# Patient Record
Sex: Female | Born: 1980 | Race: White | Hispanic: No | Marital: Married | Smoking: Current every day smoker
Health system: Southern US, Community
[De-identification: ages and names within clinical notes are randomized; demographics above are authoritative.]

## PROBLEM LIST (undated history)

## (undated) DIAGNOSIS — I493 Ventricular premature depolarization: Secondary | ICD-10-CM

## (undated) DIAGNOSIS — E785 Hyperlipidemia, unspecified: Secondary | ICD-10-CM

## (undated) DIAGNOSIS — G43909 Migraine, unspecified, not intractable, without status migrainosus: Secondary | ICD-10-CM

## (undated) DIAGNOSIS — L732 Hidradenitis suppurativa: Secondary | ICD-10-CM

## (undated) DIAGNOSIS — G4733 Obstructive sleep apnea (adult) (pediatric): Principal | ICD-10-CM

## (undated) DIAGNOSIS — I10 Essential (primary) hypertension: Secondary | ICD-10-CM

## (undated) DIAGNOSIS — L409 Psoriasis, unspecified: Secondary | ICD-10-CM

## (undated) DIAGNOSIS — K219 Gastro-esophageal reflux disease without esophagitis: Secondary | ICD-10-CM

## (undated) DIAGNOSIS — F419 Anxiety disorder, unspecified: Secondary | ICD-10-CM

## (undated) HISTORY — DX: Migraine, unspecified, not intractable, without status migrainosus: G43.909

## (undated) HISTORY — DX: Hidradenitis suppurativa: L73.2

## (undated) HISTORY — DX: Essential (primary) hypertension: I10

## (undated) HISTORY — DX: Obstructive sleep apnea (adult) (pediatric): G47.33

## (undated) HISTORY — DX: Ventricular premature depolarization: I49.3

## (undated) HISTORY — DX: Hyperlipidemia, unspecified: E78.5

## (undated) HISTORY — DX: Psoriasis, unspecified: L40.9

---
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
## 2000-11-18 ENCOUNTER — Other Ambulatory Visit: Admission: RE | Admit: 2000-11-18 | Discharge: 2000-11-18 | Payer: Self-pay | Admitting: Unknown Physician Specialty

## 2000-11-18 ENCOUNTER — Other Ambulatory Visit: Admission: RE | Admit: 2000-11-18 | Discharge: 2000-11-18 | Payer: Self-pay | Admitting: Family Medicine

## 2002-02-02 ENCOUNTER — Other Ambulatory Visit: Admission: RE | Admit: 2002-02-02 | Discharge: 2002-02-02 | Payer: Self-pay | Admitting: Family Medicine

## 2002-03-10 ENCOUNTER — Emergency Department (HOSPITAL_COMMUNITY): Admission: EM | Admit: 2002-03-10 | Discharge: 2002-03-10 | Payer: Self-pay | Admitting: Emergency Medicine

## 2002-08-26 ENCOUNTER — Encounter (HOSPITAL_BASED_OUTPATIENT_CLINIC_OR_DEPARTMENT_OTHER): Payer: Self-pay | Admitting: General Surgery

## 2002-08-26 ENCOUNTER — Ambulatory Visit (HOSPITAL_COMMUNITY): Admission: RE | Admit: 2002-08-26 | Discharge: 2002-08-26 | Payer: Self-pay | Admitting: General Surgery

## 2003-01-09 ENCOUNTER — Ambulatory Visit (HOSPITAL_COMMUNITY): Admission: RE | Admit: 2003-01-09 | Discharge: 2003-01-09 | Payer: Self-pay

## 2003-02-14 ENCOUNTER — Other Ambulatory Visit: Admission: RE | Admit: 2003-02-14 | Discharge: 2003-02-14 | Payer: Self-pay | Admitting: Family Medicine

## 2003-03-06 ENCOUNTER — Encounter: Payer: Self-pay | Admitting: Family Medicine

## 2003-03-06 ENCOUNTER — Ambulatory Visit (HOSPITAL_COMMUNITY): Admission: RE | Admit: 2003-03-06 | Discharge: 2003-03-06 | Payer: Self-pay | Admitting: Family Medicine

## 2004-03-12 ENCOUNTER — Other Ambulatory Visit: Admission: RE | Admit: 2004-03-12 | Discharge: 2004-03-12 | Payer: Self-pay | Admitting: Family Medicine

## 2005-06-05 ENCOUNTER — Ambulatory Visit (HOSPITAL_COMMUNITY): Admission: RE | Admit: 2005-06-05 | Discharge: 2005-06-05 | Payer: Self-pay | Admitting: Obstetrics & Gynecology

## 2005-06-14 ENCOUNTER — Inpatient Hospital Stay: Admission: AD | Admit: 2005-06-14 | Discharge: 2005-06-14 | Payer: Self-pay | Admitting: Obstetrics & Gynecology

## 2005-07-10 ENCOUNTER — Inpatient Hospital Stay (HOSPITAL_COMMUNITY): Admission: AD | Admit: 2005-07-10 | Discharge: 2005-07-10 | Payer: Self-pay | Admitting: Obstetrics and Gynecology

## 2005-08-17 ENCOUNTER — Inpatient Hospital Stay (HOSPITAL_COMMUNITY): Admission: AD | Admit: 2005-08-17 | Discharge: 2005-08-17 | Payer: Self-pay | Admitting: Gastroenterology

## 2005-08-25 ENCOUNTER — Inpatient Hospital Stay (HOSPITAL_COMMUNITY): Admission: AD | Admit: 2005-08-25 | Discharge: 2005-08-29 | Payer: Self-pay | Admitting: Obstetrics and Gynecology

## 2005-10-02 ENCOUNTER — Other Ambulatory Visit: Admission: RE | Admit: 2005-10-02 | Discharge: 2005-10-02 | Payer: Self-pay | Admitting: Obstetrics and Gynecology

## 2007-02-04 ENCOUNTER — Emergency Department (HOSPITAL_COMMUNITY): Admission: EM | Admit: 2007-02-04 | Discharge: 2007-02-04 | Payer: Self-pay | Admitting: Emergency Medicine

## 2007-07-13 ENCOUNTER — Ambulatory Visit (HOSPITAL_COMMUNITY): Admission: RE | Admit: 2007-07-13 | Discharge: 2007-07-13 | Payer: Self-pay | Admitting: *Deleted

## 2010-09-19 ENCOUNTER — Ambulatory Visit (HOSPITAL_COMMUNITY): Admission: RE | Admit: 2010-09-19 | Discharge: 2010-09-19 | Payer: Self-pay | Admitting: Family Medicine

## 2010-09-24 ENCOUNTER — Ambulatory Visit (HOSPITAL_COMMUNITY): Admission: RE | Admit: 2010-09-24 | Discharge: 2010-09-24 | Payer: Self-pay | Admitting: Family Medicine

## 2011-03-21 NOTE — Discharge Summary (Signed)
Barbara Jefferson, Barbara Jefferson                ACCOUNT NO.:  [id number]   MEDICAL RECORD NO.:  [id number]          PATIENT TYPE:  INP   LOCATION:  9146                          FACILITY:  [facility]   PHYSICIAN:  Miguel Aschoff, M.D.       DATE OF BIRTH:  06/08/1981   DATE OF ADMISSION:  08/25/2005  DATE OF DISCHARGE:  08/29/2005                                 DISCHARGE SUMMARY   FINAL DIAGNOSES:  1.  Intrauterine pregnancy at 39+ weeks gestation.  2.  Induction.  3.  Arrest of the active phase of labor.  4.  Postoperative anemia.   SURGEON:  Dr. Carrington Clamp.   ASSISTANT:  Dr. Lodema Hong.   COMPLICATIONS:  None.   This 30 year old G1 P0 presents at 39+ weeks gestation for induction with  favorable cervix. The patient's antepartum course up to this point had been  complicated by history of smoking during the pregnancy; cessation was  discussed with the patient. The patient continued to smoke. The patient also  was Rh negative, did receive RhoGAM at 28 weeks. She had a negative group B  strep culture obtained in the office at 35 weeks. She is admitted for  induction at this time. The patient had a normal labor curve. She dilated to  about 8 cm and stayed at that for about 4 hours with no progress. At this  point a discussion was held with the patient and a decision was made to  proceed with a cesarean section secondary to the arrest of the active phase  of labor. The patient was taken to the operating room by Dr. Carrington Clamp where primary low transverse cesarean section was performed with the  delivery of an 8-pound 3-ounce female infant with Apgars of 8 and 9. The baby  was in an OP presentation. Delivery went without complications. The  patient's postoperative course was complicated by some postoperative anemia.  She was started on iron during her hospital course. She was felt ready for  discharge on postoperative day #3. She was sent home on a regular diet, told  to decrease activities,  told to continue her iron supplement daily, was  given Tylox one to two every 4 hours as needed for pain, was told she could  use over-the-counter ibuprofen up to 600 mg every 6 hours as needed for  pain, and was to follow up in the office in 4-6 weeks.   LABORATORY ON DISCHARGE:  The patient had a hemoglobin of 8.0; white blood  cell count of 10.8; and platelets of 154,000. The patient did receive her  RhoGAM shot before discharge as well.      [name], P.A.-C.      Miguel Aschoff, M.D.  Electronically Signed    MB/MEDQ  D:  09/18/2005  T:  09/18/2005  Job:  [id number]

## 2011-03-21 NOTE — Op Note (Signed)
Barbara Jefferson, VANGORDEN                ACCOUNT NO.:  [id number]   MEDICAL RECORD NO.:  [id number]          PATIENT TYPE:  INP   LOCATION:  9146                          FACILITY:  [facility]   PHYSICIAN:  Carrington Clamp, M.D. DATE OF BIRTH:  [birth year]   DATE OF PROCEDURE:  08/26/2005  DATE OF DISCHARGE:                                 OPERATIVE REPORT   PREOPERATIVE DIAGNOSIS:  Arrest of active phase.   POSTOPERATIVE DIAGNOSIS:  Arrest of active phase.   PROCEDURES:  Primary low transverse cesarean section.   SURGEON:  Carrington Clamp, M.D.   ASSISTANT:  Luvenia Redden, M.D.   ANESTHESIA:  Was epidural.   SPECIMENS:  None.   ESTIMATED BLOOD LOSS:  Was 600 mL.   IV FLUIDS:  2500 mL.   URINE OUTPUT:  Was 175 mL.   COMPLICATIONS:  None.   FINDINGS:  A female infant in vertex presentation, OP, Apgars 08/09, weight 8  pounds 3 ounces. Normal tubes, ovaries and uterus seen. Medications were  Methergine, Pitocin and Ancef. Counts were correct x3.   TECHNIQUE:  After adequate epidural anesthesia was achieved, the patient was  prepped, draped in sterile fashion in dorsal supine position with leftward  tilt.  A Pfannenstiel skin incision was made with scalpel, carried down to  fascia with Bovie cautery. The fascia was incised in midline with scalpel  and carried in a transverse curvilinear manner with the Mayo scissors. The  fascia was reflected superiorly and inferiorly from the rectus muscles. The  rectus muscles split in midline. Bowel free portion peritoneum was tented up  and entered into carefully. The peritoneum was stretched open and the  bladder blade placed. Vesicouterine fascia was tented up and cut in a  transverse curvilinear manner with the Metzenbaum scissors. The bladder flap  was created bluntly and the bladder blade replaced.   A 2 cm incision was made transversely in the upper portion of the lower  uterine segment until meconium could be seen. This was the first  time  meconium was noted. The baby was identified in the vertex presentation and  the head was delivered through the incision. Attention then was made at the  DeLee suction, although suction was not strong enough to adequately clear  the nares of the baby. Baby was then delivered and handed to awaiting  pediatrics after cord was clamped and cut. Cord bloods were obtained and the  placenta removed manually. The uterus was then exteriorized, wrapped in wet  lap, cleared of all debris. The uterine incision was closed with running  stitch of 0 Monocryl. An imbricating layer of 0 Monocryl was performed. The  uterus was extremely limp and boggy at first and once closure and 40 units  of Pitocin were given it started to firm up a little bit. A dose of  Methergine was given at this point as well and although the uterus was not  quite as firm as it could be, it was certainly on its way to being more  firm. Uterine incision was hemostatic and the uterus was reapproximated in  the abdominal  cavity. Irrigation was performed and gutters cleared of  debris. Uterine incision was reinspected, found to be hemostatic. All  instruments withdrawn from the abdomen and the peritoneum was closed running  stitch of 2-0 Vicryl. This incorporated in the same stitch but separate  layer of the closure of the rectus muscles. The fascia was closed with  running stitch of 0 Vicryl. The subcutaneous tissue was rendered hemostatic  with Bovie cautery and irrigation and then closed with interrupted stitches.  The skin was closed with staples. The patient tolerated the procedure well.  She returns to recovery room in stable condition.      Carrington Clamp, M.D.  Electronically Signed     MH/MEDQ  D:  08/26/2005  T:  08/26/2005  Job:  [id number]

## 2011-07-20 ENCOUNTER — Encounter: Payer: Self-pay | Admitting: *Deleted

## 2011-07-20 ENCOUNTER — Emergency Department (HOSPITAL_BASED_OUTPATIENT_CLINIC_OR_DEPARTMENT_OTHER)
Admission: EM | Admit: 2011-07-20 | Discharge: 2011-07-20 | Disposition: A | Discharge status: Home / Self Care | Payer: 59 | Attending: Emergency Medicine | Admitting: Emergency Medicine

## 2011-07-20 DIAGNOSIS — F17200 Nicotine dependence, unspecified, uncomplicated: Secondary | ICD-10-CM | POA: Insufficient documentation

## 2011-07-20 DIAGNOSIS — M542 Cervicalgia: Secondary | ICD-10-CM | POA: Insufficient documentation

## 2011-07-20 MED ORDER — CYCLOBENZAPRINE HCL 5 MG PO TABS: 5.0000 mg | ORAL_TABLET | Freq: Two times a day (BID) | ORAL | Status: AC | PRN

## 2011-07-20 NOTE — ED Notes (Signed)
Care plan reviewed with pt review of Meds

## 2011-07-20 NOTE — ED Notes (Signed)
Pt states she got out of shower and noticed she had right side neck and arm pain. No known injury.

## 2011-07-20 NOTE — ED Notes (Signed)
Pt reports neck pain with radiation to R arm x 2 days denies any Injury

## 2011-07-20 NOTE — ED Provider Notes (Signed)
History     CSN: [id number] Arrival date & time: 07/20/2011  3:08 PM   Chief Complaint  Patient presents with  . 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 female presenting with neck pain. The history is provided by the patient.  Neck Pain  This is a new problem. The current episode started more than 2 days ago. The problem occurs hourly. The problem has not changed since onset.There has been no fever. The pain is present in the right side. The pain does not radiate. The pain is moderate. The symptoms are aggravated by position. Pertinent negatives include no numbness, no tingling and no weakness. She has tried muscle relaxants and NSAIDs for the symptoms. The treatment provided mild relief.     History reviewed. No pertinent past medical history.   Past Surgical History  Procedure Date  . Cesarean section     History reviewed. No pertinent family history.  History  Substance Use Topics  . Smoking status: Current Everyday Smoker  . Smokeless tobacco: Not on file  . Alcohol Use: No    OB History    Grav Para Term Preterm Abortions TAB SAB Ect Mult Living                  Review of Systems  HENT: Positive for neck pain.   Neurological: Negative for tingling, weakness and numbness.  All other systems reviewed and are negative.    Allergies  Review of patient's allergies indicates no known allergies.  Home Medications   Current Outpatient Rx  Name Route Sig Dispense Refill  . ACETAMINOPHEN 500 MG PO TABS Oral Take 500 mg by mouth every 6 (six) hours as needed. Pain      . ETONOGESTREL-ETHINYL ESTRADIOL 0.12-0.015 MG/24HR VA RING Vaginal Place 1 each vaginally every 28 (twenty-eight) days. Insert vaginally and leave in place for 3 consecutive weeks, then remove for 1  week.     . IBUPROFEN 200 MG PO TABS Oral Take 600 mg by mouth every 6 (six) hours as needed. pain       Physical Exam    BP 133/81  Pulse 89  Temp(Src) 98.3 F (36.8 C) (Oral)  Resp 16  Ht 5\' 4"  (1.626 m)  Wt 190 lb (86.183 kg)  BMI 32.61 kg/m2  SpO2 99%  Physical Exam  Nursing note and vitals reviewed. Constitutional: She appears well-developed.  HENT:  Head: Normocephalic and atraumatic.  Neck: Normal range of motion. Neck supple.  Cardiovascular: Normal rate and regular rhythm.   Pulmonary/Chest: Effort normal and breath sounds normal.  Musculoskeletal: Normal range of motion.       Cervical back: She exhibits bony tenderness. She exhibits no edema.  Neurological: She is alert.    ED Course  Procedures  No results found for this or any previous visit. No results found.   No diagnosis found.   MDM Pt not having any deficits:likely muscle strain:don't think any need for imaging at this time       Teressa Lower, NP 07/20/11 1612

## 2011-07-22 NOTE — ED Provider Notes (Signed)
History/physical exam/procedure(s) were performed by non-physician practitioner and as supervising physician I was immediately available for consultation/collaboration. I have reviewed all notes and am in agreement with care and plan.  Hilario Quarry, MD 07/22/11 [phone]

## 2011-08-05 ENCOUNTER — Encounter (HOSPITAL_BASED_OUTPATIENT_CLINIC_OR_DEPARTMENT_OTHER): Payer: Self-pay | Admitting: *Deleted

## 2011-08-05 ENCOUNTER — Emergency Department (HOSPITAL_BASED_OUTPATIENT_CLINIC_OR_DEPARTMENT_OTHER)
Admission: EM | Admit: 2011-08-05 | Discharge: 2011-08-05 | Disposition: A | Discharge status: Home / Self Care | Payer: 59 | Attending: Emergency Medicine | Admitting: Emergency Medicine

## 2011-08-05 DIAGNOSIS — R059 Cough, unspecified: Secondary | ICD-10-CM

## 2011-08-05 DIAGNOSIS — R05 Cough: Secondary | ICD-10-CM

## 2011-08-05 DIAGNOSIS — F17200 Nicotine dependence, unspecified, uncomplicated: Secondary | ICD-10-CM | POA: Insufficient documentation

## 2011-08-05 MED ORDER — BENZONATATE 100 MG PO CAPS
200.0000 mg | ORAL_CAPSULE | Freq: Once | ORAL | Status: AC
  Administered 2011-08-05: 200 mg via ORAL
  Filled 2011-08-05: qty 2

## 2011-08-05 MED ORDER — HYDROCOD POLST-CHLORPHEN POLST 10-8 MG/5ML PO LQCR: 5.0000 mL | Freq: Two times a day (BID) | ORAL | Status: DC | PRN

## 2011-08-05 MED ORDER — BENZONATATE 200 MG PO CAPS: 200.0000 mg | ORAL_CAPSULE | Freq: Three times a day (TID) | ORAL | Status: AC | PRN

## 2011-08-05 NOTE — ED Provider Notes (Signed)
History     CSN: [id number] Arrival date & time: 08/05/2011  9:28 PM  Chief Complaint  Patient presents with  . Cough    HPI Patient is a 30 yo F who presents with 3 days of cough.  She denies any other symptoms including fever, nasal congestion, chest pain, or any pain at all.  She says the cough is severe enough that she has not been able to sleep.  She has tried decongestants, cough syrup, and cough medicine without any relief.  There are no other associated or modifying factors.  The cough has been non-productive.  History reviewed. No pertinent past medical history.  Past Surgical History  Procedure Date  . Cesarean section     No family history on file.  History  Substance Use Topics  . Smoking status: Current Everyday Smoker  . Smokeless tobacco: Not on file  . Alcohol Use: No    OB History    Grav Para Term Preterm Abortions TAB SAB Ect Mult Living                  Review of Systems  Constitutional: Negative.   HENT: Negative.   Eyes: Negative.   Respiratory: Positive for cough.   Cardiovascular: Negative.   Gastrointestinal: Negative.   Genitourinary: Negative.   Musculoskeletal: Negative.   Skin: Negative.   Neurological: Negative.   Hematological: Negative.   Psychiatric/Behavioral: Negative.   All other systems reviewed and are negative.    Allergies  Review of patient's allergies indicates no known allergies.  Home Medications   Current Outpatient Rx  Name Route Sig Dispense Refill  . ETONOGESTREL-ETHINYL ESTRADIOL 0.12-0.015 MG/24HR VA RING Vaginal Place 1 each vaginally every 28 (twenty-eight) days. Insert vaginally and leave in place for 3 consecutive weeks, then remove for 1 week.     . ACETAMINOPHEN 500 MG PO TABS Oral Take 500 mg by mouth every 6 (six) hours as needed. Pain      . IBUPROFEN 200 MG PO TABS Oral Take 600 mg by mouth every 6 (six) hours as needed. pain       BP 145/92  Pulse 89  Temp(Src) 98.7 F (37.1 C) (Oral)   Resp 17  SpO2 98%  Physical Exam  Nursing note and vitals reviewed. Constitutional: She is oriented to person, place, and time. She appears well-developed and well-nourished. No distress.  HENT:  Head: Normocephalic and atraumatic.  Eyes: Conjunctivae are normal. Pupils are equal, round, and reactive to light.  Neck: Normal range of motion.  Cardiovascular: Normal rate, regular rhythm, normal heart sounds and intact distal pulses.  Exam reveals no gallop and no friction rub.   No murmur heard. Pulmonary/Chest: Effort normal and breath sounds normal. No respiratory distress. She has no wheezes. She has no rales.  Abdominal: Soft. Bowel sounds are normal. She exhibits no distension. There is no tenderness. There is no rebound and no guarding.  Musculoskeletal: Normal range of motion. She exhibits no edema.  Neurological: She is alert and oriented to person, place, and time. No cranial nerve deficit. Coordination normal.  Skin: Skin is warm and dry. No rash noted. No erythema.  Psychiatric: She has a normal mood and affect.    ED Course  Procedures (including critical care time)  Labs Reviewed - No data to display No results found.   1. Cough       MDM  Patient was evaluated and was concerned only about treatment of her cough.  She had no  fever or shortness of breath.  She was given tessalon perles.  She was given a prescription fo this and tussionex.  She did not require additional work-up today.  She was also instructed to stop smoking.  Patient was discharged home in good condition.        Cyndra Numbers, MD 08/08/11 [phone]

## 2011-08-05 NOTE — ED Notes (Signed)
Pt states that she has had a cough since yesterday and was told to eat ice chips and not use her inhaler. Pt states that she has no hx of asthma. Pt is not in distress.

## 2011-08-05 NOTE — ED Notes (Signed)
Cough since yesterday.   

## 2011-10-07 ENCOUNTER — Other Ambulatory Visit (HOSPITAL_COMMUNITY): Payer: Self-pay | Admitting: Family Medicine

## 2011-10-07 DIAGNOSIS — D4410 Neoplasm of uncertain behavior of unspecified adrenal gland: Secondary | ICD-10-CM

## 2011-10-07 DIAGNOSIS — R5381 Other malaise: Secondary | ICD-10-CM

## 2011-10-07 DIAGNOSIS — E785 Hyperlipidemia, unspecified: Secondary | ICD-10-CM

## 2011-10-09 ENCOUNTER — Ambulatory Visit (HOSPITAL_COMMUNITY): Payer: 59

## 2011-10-13 ENCOUNTER — Ambulatory Visit (HOSPITAL_COMMUNITY)
Admission: RE | Admit: 2011-10-13 | Discharge: 2011-10-13 | Disposition: A | Discharge status: Home / Self Care | Payer: 59 | Source: Ambulatory Visit | Attending: Family Medicine | Admitting: Family Medicine

## 2011-10-13 DIAGNOSIS — E785 Hyperlipidemia, unspecified: Secondary | ICD-10-CM

## 2011-10-13 DIAGNOSIS — E279 Disorder of adrenal gland, unspecified: Secondary | ICD-10-CM | POA: Insufficient documentation

## 2011-10-13 DIAGNOSIS — D4410 Neoplasm of uncertain behavior of unspecified adrenal gland: Secondary | ICD-10-CM

## 2011-10-13 DIAGNOSIS — K8020 Calculus of gallbladder without cholecystitis without obstruction: Secondary | ICD-10-CM | POA: Insufficient documentation

## 2011-10-13 DIAGNOSIS — R5381 Other malaise: Secondary | ICD-10-CM

## 2012-11-10 ENCOUNTER — Other Ambulatory Visit (HOSPITAL_COMMUNITY): Payer: Self-pay | Admitting: Family Medicine

## 2012-11-10 DIAGNOSIS — R1011 Right upper quadrant pain: Secondary | ICD-10-CM

## 2012-11-12 ENCOUNTER — Encounter (HOSPITAL_COMMUNITY): Payer: Self-pay

## 2012-11-12 ENCOUNTER — Encounter (HOSPITAL_COMMUNITY)
Admission: RE | Admit: 2012-11-12 | Discharge: 2012-11-12 | Disposition: A | Discharge status: Home / Self Care | Payer: PRIVATE HEALTH INSURANCE | Source: Ambulatory Visit | Attending: Family Medicine | Admitting: Family Medicine

## 2012-11-12 DIAGNOSIS — R1011 Right upper quadrant pain: Secondary | ICD-10-CM

## 2012-11-12 MED ORDER — TECHNETIUM TC 99M MEBROFENIN IV KIT
5.0000 | PACK | Freq: Once | INTRAVENOUS | Status: AC | PRN
  Administered 2012-11-12: 5.5 via INTRAVENOUS

## 2012-12-09 ENCOUNTER — Ambulatory Visit (INDEPENDENT_AMBULATORY_CARE_PROVIDER_SITE_OTHER): Payer: Self-pay | Admitting: General Surgery

## 2012-12-10 ENCOUNTER — Encounter (INDEPENDENT_AMBULATORY_CARE_PROVIDER_SITE_OTHER): Payer: Self-pay | Admitting: General Surgery

## 2012-12-10 ENCOUNTER — Ambulatory Visit (INDEPENDENT_AMBULATORY_CARE_PROVIDER_SITE_OTHER): Payer: PRIVATE HEALTH INSURANCE | Admitting: General Surgery

## 2012-12-10 VITALS — BP 124/80 | HR 88 | Temp 98.8°F | Resp 18 | Ht 64.0 in | Wt 213.0 lb

## 2012-12-10 DIAGNOSIS — K8020 Calculus of gallbladder without cholecystitis without obstruction: Secondary | ICD-10-CM

## 2012-12-10 NOTE — Progress Notes (Signed)
Patient ID: Barbara Jefferson, female   DOB: 29-Jun-1981, 32 y.o.   MRN: [id number]  Chief Complaint  Patient presents with  . New Evaluation    [name]    HPI Barbara Jefferson is a 32 y.o. female.  Self referred HPI This is a 32 year old female who has high triglycerides and migraines who presents with about a one-month history of some nausea where she's not been eating as much. This lasted for about a week and then improved. She also had some upper chest pain. When asked she points to her upper chest as well as almost to the base of her left neck. She described no abdominal pain associated with this. She has had no real changes in her bowel movements at all either. She's been evaluated and has undergone an EKG which she reports as normal. She has changed to a low-fat diet and many of her symptoms have resolved with this. She has some bloating now she's increased her fiber intake. She was on some Prilosec previously but when she's been on a low-fat diet she is not needed Prilosec anymore at all. She has undergone ultrasound in the past that showed gallstones. She has also undergone an MRI that shows gallstones and this was due to follow up for adrenal mass. She recently also has a hiatus scan that is below.  Past Medical History  Diagnosis Date  . Migraine   . Hyperlipidemia     Past Surgical History  Procedure Date  . Cesarean section 08/26/05    Family History  Problem Relation Age of Onset  . Diabetes Father   . Hypertension Father   . Hypertension Brother     Social History History  Substance Use Topics  . Smoking status: Current Every Day Smoker -- 0.5 packs/day  . Smokeless tobacco: Not on file     Comment: smoking 10 yrs  . Alcohol Use: No    No Known Allergies  Current Outpatient Prescriptions  Medication Sig Dispense Refill  . etonogestrel-ethinyl estradiol (NUVARING) 0.12-0.015 MG/24HR vaginal ring Place 1 each vaginally every 28 (twenty-eight) days. Insert vaginally and leave  in place for 3 consecutive weeks, then remove for 1 week.       . fenofibrate 160 MG tablet Take 160 mg by mouth daily.      Marland Kitchen ibuprofen (ADVIL,MOTRIN) 200 MG tablet Take 600 mg by mouth every 6 (six) hours as needed. pain       . niacin (NIASPAN) 1000 MG CR tablet Take 1,000 mg by mouth 2 (two) times daily.      Marland Kitchen acetaminophen (TYLENOL) 500 MG tablet Take 500 mg by mouth every 6 (six) hours as needed. Pain        . chlorpheniramine-HYDROcodone (TUSSIONEX PENNKINETIC ER) 10-8 MG/5ML LQCR Take 5 mLs by mouth every 12 (twelve) hours as needed (cough).  115 mL  0    Review of Systems Review of Systems  Constitutional: Negative for fever, chills and unexpected weight change.  HENT: Negative for hearing loss, congestion, sore throat, trouble swallowing and voice change.   Eyes: Negative for visual disturbance.  Respiratory: Negative for cough and wheezing.   Cardiovascular: Negative for chest pain, palpitations and leg swelling.  Gastrointestinal: Negative for nausea, vomiting, abdominal pain, diarrhea, constipation, blood in stool, abdominal distention and anal bleeding.  Genitourinary: Negative for hematuria, vaginal bleeding and difficulty urinating.  Musculoskeletal: Negative for arthralgias.  Skin: Negative for rash and wound.  Neurological: Negative for seizures, syncope and headaches.  Hematological: Negative for  adenopathy. Does not bruise/bleed easily.  Psychiatric/Behavioral: Negative for confusion.    Blood pressure 124/80, pulse 88, temperature 98.8 F (37.1 C), resp. rate 18, height 5\' 4"  (1.626 m), weight 213 lb (96.616 kg), last menstrual period 11/09/2012.  Physical Exam Physical Exam  Vitals reviewed. Constitutional: She appears well-developed and well-nourished.  Cardiovascular: Normal rate, regular rhythm and normal heart sounds.   Pulmonary/Chest: Effort normal. She has no wheezes. She has no rales.  Abdominal: Soft. Bowel sounds are normal. There is no tenderness.     Data Reviewed NUCLEAR MEDICINE HEPATOBILIARY IMAGING WITH GALLBLADDER EF:  Technique: Sequential images of the abdomen were obtained for 60  minutes following intravenous administration of  radiopharmaceutical. Patient then ingested 8 ounces of  commercially available Ensure Plus and imaging was continued for 60  minutes. A time-activity curve was generated from tracer within  the gallbladder following Ensure Plus ingestion, and the  gallbladder ejection fraction was calculated.  Radiopharmaceutical: 5.5 mCi Tc-56m mebrofenin  Comparison: None  Findings:  Prompt tracer extraction from bloodstream, indicating normal  hepatocellular function.  Prompt excretion of tracer into biliary tree.  Gallbladder visualized at 15 minutes.  Small bowel visualized at 34 minutes.  No hepatic retention of tracer.  Subjectively decreased emptying of tracer from gallbladder  following fatty meal stimulation.  Calculated gallbladder ejection fraction is 30%, minimally  decreased.  Patient experienced no symptoms following fatty meal ingestion.  IMPRESSION:  Patent biliary tree.  Minimally decreased gallbladder ejection fraction following fatty  meal stimulation.   Assessment    Asymptomatic cholelithiasis    Plan    She describes upper chest pain. She did not really have any symptoms that I can find that are referable to her gallbladder at this time. 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 Her HIDA is essentially negative. She had no symptoms during that scan and all either. I think her gallstones are asymptomatic and I recommended to her that she not undergo surgery at this time. We did discuss his symptoms are related to gallbladder disease and she certainly might develop symptoms moving forward.       Vitali Seibert 12/10/2012, 2:56 PM

## 2014-05-26 IMAGING — NM NM HEPATO W/GB/PHARM/[PERSON_NAME]
2 series · 12 of 12 positions shown · non-contrast
Comparison: None

CLINICAL DATA: Right upper quadrant pain, nausea, history
cholelithiasis

NUCLEAR MEDICINE HEPATOBILIARY IMAGING WITH GALLBLADDER EF:
TECHNIQUE: Sequential images of the abdomen were obtained for 60
minutes following intravenous administration of
radiopharmaceutical.  Patient then ingested 8 ounces of
commercially available Ensure Plus and imaging was continued for 60
minutes.  A time-activity curve was generated from tracer within
the gallbladder following Ensure Plus ingestion, and the
gallbladder ejection fraction was calculated.
Radiopharmaceutical:  5.5 mCi Mc-UUm mebrofenin

[hida · 3.20mm/px · 6 of 60 frames shown (1 of 2)]
[frame 6/60]
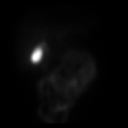
[frame 16/60]
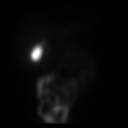
[frame 26/60]
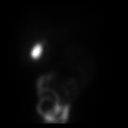
[frame 36/60]
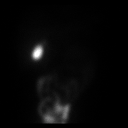
[frame 46/60]
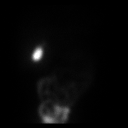
[frame 56/60]
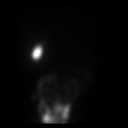

[hida · 3.20mm/px · 6 of 60 frames shown (2 of 2)]
[frame 6/60]
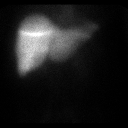
[frame 16/60]
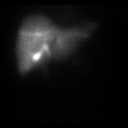
[frame 26/60]
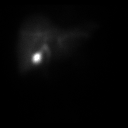
[frame 36/60]
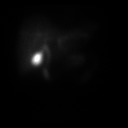
[frame 46/60]
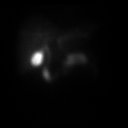
[frame 56/60]
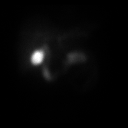

[12 of 12 positions shown; findings below may reference images not displayed]

FINDINGS: Prompt tracer extraction from bloodstream, indicating normal
hepatocellular function.
Prompt excretion of tracer into biliary tree.
Gallbladder visualized at 15 minutes.
Small bowel visualized at 34 minutes.
No hepatic retention of tracer.

Subjectively decreased emptying of tracer from gallbladder
following fatty meal stimulation.
Calculated gallbladder ejection fraction is 30%, minimally
decreased.
Patient experienced no symptoms following fatty meal ingestion.
IMPRESSION: Patent biliary tree.
Minimally decreased gallbladder ejection fraction following fatty
meal stimulation.

Normal values for gallbladder ejection fraction:
> 30% for exams utilizing sincalide (CCK)
> 33% for exams utilizing fatty meal stimulation with Ensure Plus

## 2016-11-27 ENCOUNTER — Other Ambulatory Visit (INDEPENDENT_AMBULATORY_CARE_PROVIDER_SITE_OTHER): Payer: PRIVATE HEALTH INSURANCE

## 2016-11-27 ENCOUNTER — Ambulatory Visit (INDEPENDENT_AMBULATORY_CARE_PROVIDER_SITE_OTHER): Payer: PRIVATE HEALTH INSURANCE

## 2016-11-27 ENCOUNTER — Other Ambulatory Visit: Payer: Self-pay | Admitting: Orthopedic Surgery

## 2016-11-27 DIAGNOSIS — M545 Low back pain: Secondary | ICD-10-CM

## 2016-11-27 DIAGNOSIS — R52 Pain, unspecified: Secondary | ICD-10-CM

## 2017-06-22 ENCOUNTER — Telehealth: Payer: Self-pay | Admitting: Cardiovascular Disease

## 2017-06-22 NOTE — Telephone Encounter (Signed)
Received records from [facility] for appointment on 07/08/17 with Dr Oval Linsey.  Records put with Dr Blenda Mounts schedule for 07/08/17. lp

## 2017-06-30 ENCOUNTER — Telehealth: Payer: Self-pay | Admitting: Nurse Practitioner

## 2017-06-30 ENCOUNTER — Ambulatory Visit (INDEPENDENT_AMBULATORY_CARE_PROVIDER_SITE_OTHER): Payer: PRIVATE HEALTH INSURANCE | Admitting: Cardiovascular Disease

## 2017-06-30 ENCOUNTER — Encounter: Payer: Self-pay | Admitting: Cardiovascular Disease

## 2017-06-30 VITALS — BP 126/80 | HR 96 | Ht 64.0 in | Wt 219.8 lb

## 2017-06-30 DIAGNOSIS — I493 Ventricular premature depolarization: Secondary | ICD-10-CM | POA: Diagnosis not present

## 2017-06-30 DIAGNOSIS — E784 Other hyperlipidemia: Secondary | ICD-10-CM | POA: Diagnosis not present

## 2017-06-30 DIAGNOSIS — E7849 Other hyperlipidemia: Secondary | ICD-10-CM

## 2017-06-30 NOTE — Patient Instructions (Signed)
Medication Instructions:  Your physician recommends that you continue on your current medications as directed. Please refer to the Current Medication list given to you today.   Labwork: None Ordered   Testing/Procedures: None Ordered   Follow-Up: Your physician recommends that you schedule a follow-up appointment in: 3 months with Dr. Acie Fredrickson  Increase your intake of fluids (water with electrolyte tabs like Nun tablets, or gatorade) , protein ( hard boiled eggs, chicken, fish) , and a electrolytes ( V-8 juice, salt, potassium chloride  which is sold as No-Salt   If you need a refill on your cardiac medications before your next appointment, please call your pharmacy.   Thank you for choosing [facility]! Christen Bame, RN [phone]

## 2017-06-30 NOTE — Telephone Encounter (Signed)
Patient called back to ask if Dr. Acie Fredrickson can prescribe something for anxiety. She states this was discussed during her office visit today. I spoke with Dr. Acie Fredrickson who advised that anxiety was discussed as a possible cause but the patient did not acknowledge that she thought this was a problem for her. He will write this as a possible cause of her palpitations but he would like her to work on lifestyle changes to decrease palpitations including decreasing caffeine consumption, starting an exercise program, and quit smoking. I advised that a copy of today's office visit will be sent to her PCP. She verbalized understanding and agreement with plan and thanked me for the call.

## 2017-06-30 NOTE — Progress Notes (Addendum)
Cardiology Office Note:    Date:  06/30/2017   ID:  Barbara Jefferson, DOB 01-29-1981, MRN [id number]  PCP:  Elsie Lincoln, MD  Cardiologist:  Mertie Moores, MD    Referring MD: Bridget Hartshorn, NP   Problem List 1.  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 Hyperlipidemia   Chief Complaint  Patient presents with  . Palpitations    History of Present Illness:    Barbara Jefferson is a 36 y.o. female with a hx of palpitations. She Has a history of palpitations for the past several years. They typically occur later in the evening or when she's trying to go to sleep. There is no syncope or presyncope. She does not describe any chest pain. There is no dyspnea.  Typically works out regularly . Has not felt well for the past 2 weeks. Not necessarily associated with her periods Has cut her caffiene - still drinks diet mountain dew.  Smokes 1/2 ddp.   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   Past Medical History:  Diagnosis Date  . Hyperlipidemia   . Migraine     Past Surgical History:  Procedure Laterality Date  . CESAREAN SECTION  08/26/05    Current Medications: Current Meds  Medication Sig  . acetaminophen (TYLENOL) 500 MG tablet Take 500 mg by mouth every 6 (six) hours as needed. Pain    . fenofibrate 160 MG tablet Take 160 mg by mouth daily.  Marland Kitchen ibuprofen (ADVIL,MOTRIN) 200 MG tablet Take 600 mg by mouth every 6 (six) hours as needed. pain   . levonorgestrel (MIRENA) 20 MCG/24HR IUD Mirena 20 mcg/24 hr (5 years) intrauterine device  Take by intrauterine route.  Marland Kitchen lisinopril (PRINIVIL,ZESTRIL) 5 MG tablet Take 1 tablet by mouth daily.  . Minocycline HCl 90 MG TB24 Take 1 tablet by mouth daily.  Marland Kitchen omeprazole (PRILOSEC) 20 MG capsule Take 1 capsule by mouth daily.  . rizatriptan (MAXALT) 10 MG tablet Take 10 mg by mouth daily as needed for migraine.     Allergies:   Latex   Social History   Social History  . Marital status: Married    Spouse name: N/A  . Number of  children: N/A  . Years of education: N/A   Social History Main Topics  . Smoking status: Current Every Day Smoker    Packs/day: 0.50  . Smokeless tobacco: Never Used     Comment: smoking 10 yrs  . Alcohol use No  . Drug use: No  . Sexual activity: Yes    Birth control/ protection: Inserts   Other Topics Concern  . None   Social History Narrative  . None     Family History: The patient's family history includes Diabetes in her father; Hypertension in her brother and father; Stroke in her mother. ROS:   Please see the history of present illness.     All other systems reviewed and are negative.  EKGs/Labs/Other Studies Reviewed:    The following studies were reviewed today:   EKG:  EKG is not  ordered today.  The ekg from 06/18/17 shows NSR with no ST or T wave changes  Recent Labs: No results found for requested labs within last 8760 hours.  Recent Lipid Panel No results found for: CHOL, TRIG, HDL, CHOLHDL, VLDL, LDLCALC, LDLDIRECT  Physical Exam:    VS:  BP 126/80   Pulse 96   Ht 5\' 4"  (1.626 m)   Wt 219 lb 12.8 oz (99.7 kg)  SpO2 97%   BMI 37.73 kg/m     Wt Readings from Last 3 Encounters:  06/30/17 219 lb 12.8 oz (99.7 kg)  12/10/12 213 lb (96.6 kg)  07/20/11 190 lb (86.2 kg)     GEN:  Well nourished, well developed in no acute distress HEENT: Normal NECK: No JVD; No carotid bruits LYMPHATICS: No lymphadenopathy CARDIAC: RRR, no murmurs, rubs, gallops RESPIRATORY:  Clear to auscultation without rales, wheezing or rhonchi  ABDOMEN: Soft, non-tender, non-distended MUSCULOSKELETAL:  No edema; No deformity  SKIN: Warm and dry NEUROLOGIC:  Alert and oriented x 3 PSYCHIATRIC:  Normal affect   ASSESSMENT:    No diagnosis found. PLAN:    In order of problems listed above:  1. 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 Ive reassured her that these are benign We discussed the typical causes of PVCs - low potassium, stress,  Discussed 30 day monitor She will  hold off for now - will lets [place] know if she wants to wear the monitor. TSH is normal, K is normal.  Discussed drinking a V-8 to help with these.   She has some anxiety issues.   This may be playing a role in her palpitations    2.  Hyperlipidemia :  Labs from [facility] were reviewed   Chol = 153 Trigs are 185. HDL - 29 LDL - 87  Will see her in 3 months    Medication Adjustments/Labs and Tests Ordered: Current medicines are reviewed at length with the patient today.  Concerns regarding medicines are outlined above.  No orders of the defined types were placed in this encounter.  No orders of the defined types were placed in this encounter.   Signed, Mertie Moores, MD  06/30/2017 10:07 AM    [place]

## 2017-07-07 ENCOUNTER — Telehealth: Payer: Self-pay | Admitting: Cardiovascular Disease

## 2017-07-07 DIAGNOSIS — R002 Palpitations: Secondary | ICD-10-CM

## 2017-07-07 NOTE — Telephone Encounter (Signed)
New message   Pt states she has questions about a monitor and was offered one.

## 2017-07-07 NOTE — Telephone Encounter (Signed)
Spoke with patient who states she has eliminated caffeine from her diet but continues to have palpitations. She requests getting a 30 day monitor as discussed with Dr. Acie Fredrickson during office visit. I advised that someone from our office will call her to schedule the monitor appointment. She verbalized understanding and thanked me for the call.

## 2017-07-08 ENCOUNTER — Ambulatory Visit: Payer: PRIVATE HEALTH INSURANCE | Admitting: Cardiovascular Disease

## 2017-07-13 ENCOUNTER — Ambulatory Visit (INDEPENDENT_AMBULATORY_CARE_PROVIDER_SITE_OTHER): Payer: PRIVATE HEALTH INSURANCE

## 2017-07-13 DIAGNOSIS — R002 Palpitations: Secondary | ICD-10-CM

## 2017-07-20 ENCOUNTER — Telehealth: Payer: Self-pay | Admitting: Cardiovascular Disease

## 2017-07-20 MED ORDER — PROPRANOLOL HCL 10 MG PO TABS: 10.0000 mg | ORAL_TABLET | Freq: Four times a day (QID) | ORAL | 11 refills | Status: DC | PRN

## 2017-07-20 NOTE — Telephone Encounter (Signed)
Cardiac monitor report shows occasional PVCs. Forwarding to Dr. Acie Fredrickson for advice.

## 2017-07-20 NOTE — Telephone Encounter (Signed)
Spoke with patient who states her PCP prescribed propranolol 20 mg and then 40 mg at bedtime. She states she has not noticed any improvement. I reviewed Dr. Elmarie Shiley protocol for Propranolol with her and she states she would like to d/c the dose prescribed by her PCP and follow his instructions. I advised I will send the Rx to [facility] and that she will need to notify the pharmacy that she is discontinuing the previous dose. I advised her to call back to let us know how she is doing. She verbalized understanding and agreement and thanked me for the call.

## 2017-07-20 NOTE — Telephone Encounter (Signed)
Barbara Jefferson is calling because she is wanting to know if it is necessary for her to continue to wear the event monitor for the remainder of the 30 days . Had it for only 8 days . Please call

## 2017-07-20 NOTE — Telephone Encounter (Signed)
Spoke with patient who called to ask if she has to wear the heart monitor for the full 30 days ordered. She states she has hit the record button many times already. I advised her that the more information we can get then the better we can understand her PVC burden and/or any other issues she may be dealing with. I encouraged her to wear the monitor as close to 30 days as possible. She asks if we could check the monitor up to this point to give her reassurance. I advised that I will check to see what has been recorded up to this point and call her back. She verbalized understanding and agreement.

## 2017-07-20 NOTE — Telephone Encounter (Signed)
Preliminary results shows occasional PVCs. I would like her  to try Propranolol 10 mg QID prn. She may also consider metoprolol 12.5 BID or perhaps 25 mg PO BID

## 2017-08-13 ENCOUNTER — Telehealth: Payer: Self-pay | Admitting: Cardiovascular Disease

## 2017-08-13 NOTE — Telephone Encounter (Signed)
Informed patient that results are not yet available but she will be called as soon as Dr. Acie Fredrickson reviews results.  She was grateful for update.

## 2017-08-13 NOTE — Telephone Encounter (Signed)
New message     Patient calling for results from monitor. Please call

## 2017-10-06 ENCOUNTER — Ambulatory Visit: Payer: PRIVATE HEALTH INSURANCE | Admitting: Cardiovascular Disease

## 2017-12-15 ENCOUNTER — Telehealth: Payer: PRIVATE HEALTH INSURANCE | Admitting: Family

## 2017-12-15 DIAGNOSIS — J019 Acute sinusitis, unspecified: Secondary | ICD-10-CM

## 2017-12-15 DIAGNOSIS — B9689 Other specified bacterial agents as the cause of diseases classified elsewhere: Secondary | ICD-10-CM

## 2017-12-15 MED ORDER — AZITHROMYCIN 250 MG PO TABS: ORAL_TABLET | ORAL | 0 refills | Status: DC

## 2017-12-15 NOTE — Progress Notes (Signed)
We are sorry that you are not feeling well.  Here is how we plan to help!  Based on what you have shared with me it looks like you have sinusitis.  Sinusitis is inflammation and infection in the sinus cavities of the head.  Based on your presentation I believe you most likely have Acute Bacterial Sinusitis.  This is an infection caused by bacteria and is treated with antibiotics. I have prescribed Z-pak as directed. You may use an oral decongestant such as Mucinex D or if you have glaucoma or high blood pressure use plain Mucinex. Saline nasal spray help and can safely be used as often as needed for congestion.  If you develop worsening sinus pain, fever or notice severe headache and vision changes, or if symptoms are not better after completion of antibiotic, please schedule an appointment with a health care provider.    Sinus infections are not as easily transmitted as other respiratory infection, however we still recommend that you avoid close contact with loved ones, especially the very young and elderly.  Remember to wash your hands thoroughly throughout the day as this is the number one way to prevent the spread of infection!  Home Care:  Only take medications as instructed by your medical team.  Complete the entire course of an antibiotic.  Do not take these medications with alcohol.  A steam or ultrasonic humidifier can help congestion.  You can place a towel over your head and breathe in the steam from hot water coming from a faucet.  Avoid close contacts especially the very young and the elderly.  Cover your mouth when you cough or sneeze.  Always remember to wash your hands.  Get Help Right Away If:  You develop worsening fever or sinus pain.  You develop a severe head ache or visual changes.  Your symptoms persist after you have completed your treatment plan.  Make sure you  Understand these instructions.  Will watch your condition.  Will get help right away if you are  not doing well or get worse.  Your e-visit answers were reviewed by a board certified advanced clinical practitioner to complete your personal care plan.  Depending on the condition, your plan could have included both over the counter or prescription medications.  If there is a problem please reply  once you have received a response from your provider.  Your safety is important to us.  If you have drug allergies check your prescription carefully.    You can use MyChart to ask questions about today's visit, request a non-urgent call back, or ask for a work or school excuse for 24 hours related to this e-Visit. If it has been greater than 24 hours you will need to follow up with your provider, or enter a new e-Visit to address those concerns.  You will get an e-mail in the next two days asking about your experience.  I hope that your e-visit has been valuable and will speed your recovery. Thank you for using e-visits.   

## 2018-02-23 ENCOUNTER — Institutional Professional Consult (permissible substitution): Payer: PRIVATE HEALTH INSURANCE | Admitting: Pulmonary Disease

## 2018-04-07 ENCOUNTER — Institutional Professional Consult (permissible substitution): Payer: PRIVATE HEALTH INSURANCE | Admitting: Pulmonary Disease

## 2018-05-11 ENCOUNTER — Ambulatory Visit (INDEPENDENT_AMBULATORY_CARE_PROVIDER_SITE_OTHER): Payer: Managed Care, Other (non HMO) | Admitting: Pulmonary Disease

## 2018-05-11 ENCOUNTER — Encounter: Payer: Self-pay | Admitting: Pulmonary Disease

## 2018-05-11 VITALS — BP 124/82 | HR 90 | Ht 64.0 in | Wt 234.0 lb

## 2018-05-11 DIAGNOSIS — Z6841 Body Mass Index (BMI) 40.0 and over, adult: Secondary | ICD-10-CM

## 2018-05-11 DIAGNOSIS — G4719 Other hypersomnia: Secondary | ICD-10-CM | POA: Diagnosis not present

## 2018-05-11 DIAGNOSIS — Z716 Tobacco abuse counseling: Secondary | ICD-10-CM

## 2018-05-11 DIAGNOSIS — R0683 Snoring: Secondary | ICD-10-CM

## 2018-05-11 NOTE — Progress Notes (Signed)
[facility]  Chief Complaint  Patient presents with  . sleep consult    Pt is self referral. Pt snores loudly, stops breathing at times, and gasps for air at times. Pt has daytime sleepiness.    Constitutional: BP 124/82 (BP Location: Left Arm, Cuff Size: Normal)   Pulse 90   Ht 5\' 4"  (1.626 m)   Wt 234 lb (106.1 kg)   SpO2 97%   BMI 40.17 kg/m   History of Present Illness: Barbara Jefferson is a 37 y.o. female snoring.  Her husband has sleep apnea.  He has been concerned about her snoring, and says she stops breathing at night.  She has noticed feeling more tired and restless sleep, especially after she has gained weight.  She has more trouble sleeping on her back.  She doesn't dream much.  She can fall asleep easily if she is working on a computer or watching TV.  She was seen recently by cardiology for palpitations, and these happen more at night.   She goes to sleep at 10 pm.  She falls asleep quickly.  She wakes up several times to shift positions.  She gets out of bed at 7 am.  She feels tired in the morning.  She intermittently gets morning headache.  She does not use anything to help her fall sleep.  She drinks coffee and soda during the day to help stay awake.  She also got a stand up desk at work to help stay awake.  She has history of bruxism, but mouth guard wasn't covered by dental insurance.  She gets pain and click sometimes when she chews.  She denies sleep walking, sleep talking, or nightmares.  There is no history of restless legs.  She denies sleep hallucinations, sleep paralysis, or cataplexy.  The Epworth score is 21 out of 24.  She continues to smoke cigarettes.  She tried wellbutrin and chantix before.  Both of these made her feel weird and she wasn't able to tolerate these.  She hasn't tried nicotine replacement.  Comprehensive Respiratory Exam:  Appearance - well kempt  ENMT - nasal mucosa moist, turbinates clear, midline  nasal septum, MP 3, enlarged tongue, click with jaw opening, no dental lesions, no gingival bleeding, no oral exudates, no tonsillar hypertrophy Neck - no masses, trachea midline, no thyromegaly, no elevation in JVP Respiratory - normal appearance of chest wall, normal respiratory effort w/o accessory muscle use, no dullness on percussion, no wheezing or rales CV - s1s2 regular rate and rhythm, no murmurs, no peripheral edema, no varicosities, radial pulses symmetric GI - soft, non tender, no masses, no hepatosplenomegaly Lymph - no adenopathy noted in neck and axillary areas MSK - normal muscle strength and tone, normal gait Ext - no cyanosis, clubbing, or joint inflammation noted Skin - no rashes, lesions, or ulcers Neuro - oriented to person, place, and time Psych - normal mood and affect  Discussion: She has snoring,sleep disruption, apnea, and daytime sleepiness.  She has history of hypertension, palpitations and migraines.  Her BMI is > 35.  She has history of TMJ.  She also continues to smoke cigarettes.  Assessment/Plan:  Snoring with excessive daytime sleepiness. - will need to arrange for a home sleep study  Obesity. - discussed how weight can impact sleep and risk for sleep disordered breathing - discussed options to assist with weight loss: combination of diet modification, cardiovascular and strength training exercises  Cardiovascular risk. - had an extensive discussion  regarding the adverse health consequences related to untreated sleep disordered breathing - specifically discussed the risks for hypertension, coronary artery disease, cardiac dysrhythmias, cerebrovascular disease, and diabetes - lifestyle modification discussed  Safe driving practices. - discussed how sleep disruption can increase risk of accidents, particularly when driving - safe driving practices were discussed  Therapies for obstructive sleep apnea. - if the sleep study shows significant sleep  apnea, then various therapies for treatment were reviewed: CPAP, oral appliance, and surgical interventions - I am concerned that she would not be able to tolerate oral appliance due to TMJ  Tobacco abuse. - discussed options to help with smoking cessation - she wasn't able to tolerate chantix or wellbutrin - discussed options for nicotine replacement    Patient Instructions  Will arrange for home sleep study Will call to arrange for follow up after sleep study reviewed    Chesley Mires, MD [place] 05/11/2018, 10:54 AM  Flow Sheet  Sleep tests:  Review of Systems: Constitutional: Positive for unexpected weight change. Negative for fever.  HENT: Positive for ear pain. Negative for congestion, dental problem, nosebleeds, postnasal drip, rhinorrhea, sinus pressure, sneezing, sore throat and trouble swallowing.   Eyes: Negative for redness and itching.  Respiratory: Negative for cough, chest tightness, shortness of breath and wheezing.   Cardiovascular: Positive for palpitations. Negative for leg swelling.  Gastrointestinal: Negative for nausea and vomiting.  Genitourinary: Negative for dysuria.  Musculoskeletal: Negative for joint swelling.  Skin: Negative for rash.  Allergic/Immunologic: Positive for environmental allergies. Negative for food allergies and immunocompromised state.  Neurological: Positive for dizziness and headaches.  Hematological: Does not bruise/bleed easily.  Psychiatric/Behavioral: Negative for dysphoric mood. The patient is nervous/anxious.    Past Medical History: She  has a past medical history of Hyperlipidemia, Hypertension, Migraine, and PVC (premature ventricular contraction).  Past Surgical History: She  has a past surgical history that includes Cesarean section (08/26/05).  Family History: Her family history includes Diabetes in her father; Hypertension in her brother and father; Stroke in her mother.  Social History: She   reports that she has been smoking.  She has a 11.00 pack-year smoking history. She has never used smokeless tobacco. She reports that she does not drink alcohol or use drugs.  Medications: Allergies as of 05/11/2018      Reactions   Latex Rash      Medication List        Accurate as of 05/11/18 10:54 AM. Always use your most recent med list.          fenofibrate 160 MG tablet Take 160 mg by mouth daily.   levonorgestrel 20 MCG/24HR IUD Commonly known as:  MIRENA Mirena 20 mcg/24 hr (5 years) intrauterine device  Take by intrauterine route.   lisinopril 5 MG tablet Commonly known as:  PRINIVIL,ZESTRIL Take 1 tablet by mouth daily.   Minocycline HCl 90 MG Tb24 Take 1 tablet by mouth daily.   omeprazole 20 MG capsule Commonly known as:  PRILOSEC Take 1 capsule by mouth daily.   rizatriptan 10 MG tablet Commonly known as:  MAXALT Take 10 mg by mouth daily as needed for migraine.

## 2018-05-11 NOTE — Progress Notes (Signed)
   Subjective:    Patient ID: Barbara Jefferson, female    DOB: October 01, 1981, 37 y.o.   MRN: [id number]  HPI    Review of Systems  Constitutional: Positive for unexpected weight change. Negative for fever.  HENT: Positive for ear pain. Negative for congestion, dental problem, nosebleeds, postnasal drip, rhinorrhea, sinus pressure, sneezing, sore throat and trouble swallowing.   Eyes: Negative for redness and itching.  Respiratory: Negative for cough, chest tightness, shortness of breath and wheezing.   Cardiovascular: Positive for palpitations. Negative for leg swelling.  Gastrointestinal: Negative for nausea and vomiting.  Genitourinary: Negative for dysuria.  Musculoskeletal: Negative for joint swelling.  Skin: Negative for rash.  Allergic/Immunologic: Positive for environmental allergies. Negative for food allergies and immunocompromised state.  Neurological: Positive for dizziness and headaches.  Hematological: Does not bruise/bleed easily.  Psychiatric/Behavioral: Negative for dysphoric mood. The patient is nervous/anxious.        Objective:   Physical Exam        Assessment & Plan:

## 2018-05-11 NOTE — Patient Instructions (Signed)
Will arrange for home sleep study Will call to arrange for follow up after sleep study reviewed  

## 2018-05-21 ENCOUNTER — Telehealth: Payer: Self-pay | Admitting: Pulmonary Disease

## 2018-05-21 NOTE — Telephone Encounter (Signed)
I had pt's sleep study pulled to call.  Called pt & have her scheduled for 7/24.  Nothing further needed.

## 2018-05-26 DIAGNOSIS — G4733 Obstructive sleep apnea (adult) (pediatric): Secondary | ICD-10-CM | POA: Diagnosis not present

## 2018-05-27 ENCOUNTER — Other Ambulatory Visit: Payer: Self-pay | Admitting: *Deleted

## 2018-05-27 DIAGNOSIS — G4719 Other hypersomnia: Secondary | ICD-10-CM

## 2018-05-27 DIAGNOSIS — R0683 Snoring: Secondary | ICD-10-CM

## 2018-05-28 ENCOUNTER — Encounter: Payer: Self-pay | Admitting: Pulmonary Disease

## 2018-05-28 ENCOUNTER — Telehealth: Payer: Self-pay | Admitting: Pulmonary Disease

## 2018-05-28 DIAGNOSIS — G4733 Obstructive sleep apnea (adult) (pediatric): Secondary | ICD-10-CM

## 2018-05-28 HISTORY — DX: Obstructive sleep apnea (adult) (pediatric): G47.33

## 2018-05-28 NOTE — Telephone Encounter (Signed)
Called and spoke with patient regarding results.  Informed the patient of results and recommendations today. Pt is agreeable to order cpap machine 5-15cm H20 with heated humidity mask of choice and supplies Placed order today to DME Pt will call to make 20mo f/u appt with VS once set up in cpap machine in home Pt verbalized understanding and denied any questions or concerns at this time.  Nothing further needed.

## 2018-05-28 NOTE — Telephone Encounter (Signed)
HST 05/26/18 >> AHI 7, SaO2 low 88%   Will have my nurse inform pt that sleep study shows mild sleep apnea.  Options are 1) CPAP now, 2) ROV first.  If She is agreeable to CPAP, then please send order for auto CPAP range 5 to 15 cm H2O with heated humidity and mask of choice.  Have download sent 1 month after starting CPAP and set up ROV 2 months after starting CPAP.  ROV can be with me or NP.

## 2018-06-14 ENCOUNTER — Telehealth: Payer: Self-pay | Admitting: Pulmonary Disease

## 2018-06-14 DIAGNOSIS — G4733 Obstructive sleep apnea (adult) (pediatric): Secondary | ICD-10-CM

## 2018-06-14 NOTE — Telephone Encounter (Signed)
Spoke with patient. She stated that she was advised a few weeks ago that [facility] will be considered out of network for her CPAP and a new order needs to be sent to [place]. Asked patient if she was ok with her information being sent to [facility], she stated that it would be fine.    CPAP RX placed for [facility]. Patient is aware. Nothing further needed at time of call.

## 2019-07-19 ENCOUNTER — Other Ambulatory Visit: Payer: Self-pay | Admitting: Adult Health Nurse Practitioner

## 2019-07-19 ENCOUNTER — Other Ambulatory Visit (HOSPITAL_COMMUNITY): Payer: Self-pay | Admitting: Adult Health Nurse Practitioner

## 2019-07-19 DIAGNOSIS — R1011 Right upper quadrant pain: Secondary | ICD-10-CM

## 2019-07-19 DIAGNOSIS — R1013 Epigastric pain: Secondary | ICD-10-CM

## 2019-07-19 DIAGNOSIS — Z8719 Personal history of other diseases of the digestive system: Secondary | ICD-10-CM

## 2019-07-22 ENCOUNTER — Other Ambulatory Visit: Payer: Self-pay

## 2019-07-22 ENCOUNTER — Ambulatory Visit (HOSPITAL_COMMUNITY)
Admission: RE | Admit: 2019-07-22 | Discharge: 2019-07-22 | Disposition: A | Discharge status: Home / Self Care | Payer: Managed Care, Other (non HMO) | Source: Ambulatory Visit | Attending: Adult Health Nurse Practitioner | Admitting: Adult Health Nurse Practitioner

## 2019-07-22 DIAGNOSIS — R1011 Right upper quadrant pain: Secondary | ICD-10-CM | POA: Diagnosis present

## 2019-07-22 DIAGNOSIS — R1013 Epigastric pain: Secondary | ICD-10-CM | POA: Insufficient documentation

## 2019-07-22 DIAGNOSIS — Z8719 Personal history of other diseases of the digestive system: Secondary | ICD-10-CM | POA: Diagnosis present

## 2019-09-27 ENCOUNTER — Ambulatory Visit (INDEPENDENT_AMBULATORY_CARE_PROVIDER_SITE_OTHER): Payer: Managed Care, Other (non HMO) | Admitting: General Surgery

## 2019-09-27 ENCOUNTER — Encounter: Payer: Self-pay | Admitting: General Surgery

## 2019-09-27 ENCOUNTER — Other Ambulatory Visit: Payer: Self-pay

## 2019-09-27 VITALS — BP 135/86 | HR 76 | Temp 98.4°F | Resp 16 | Ht 64.0 in | Wt 231.0 lb

## 2019-09-27 DIAGNOSIS — K8020 Calculus of gallbladder without cholecystitis without obstruction: Secondary | ICD-10-CM | POA: Insufficient documentation

## 2019-09-27 NOTE — H&P (Signed)
[facility] History and Physical  Reason for Referral: Gallstones  Referring Physician: Bridget Hartshorn, NP      Chief Complaint    Abdominal Pain      Barbara Jefferson is a 38 y.o. female.  HPI:  Barbara Jefferson is a 38 yo who has had years of epigastric and RUQ pain and even reports taking to Barbara Jefferson about 10 years ago about he gallbladder. At that time, it was thought she was not having severe enough symptoms to get her gallbladder. Over the past several weeks she has described epigastric pain that radiates to her right under her rib cage and extending into her back. She says that this occurs randomly but it could be related to greasy foods. She says that she has nausea/bloating but no vomiting. She also has been having some diarrhea.  She otherwise is relatively healthy. She does take some propanolol for her HTN and this also helps her migraines.       Past Medical History:  Diagnosis Date  . Hidradenitis suppurativa   . Hyperlipidemia   . Hypertension   . Migraine   . OSA (obstructive sleep apnea) 05/28/2018  . Psoriasis   . PVC (premature ventricular contraction)          Past Surgical History:  Procedure Laterality Date  . CESAREAN SECTION  08/26/05         Family History  Problem Relation Age of Onset  . Stroke Mother   . Diabetes Father   . Hypertension Father   . Hypertension Brother     Social History        Tobacco Use  . Smoking status: Current Every Day Smoker    Packs/day: 0.50    Years: 22.00    Pack years: 11.00  . Smokeless tobacco: Never Used  Substance Use Topics  . Alcohol use: No  . Drug use: No    Medications: I have reviewed the patient's current medications.      Allergies as of 09/27/2019      Reactions   Latex Rash      Medication List       Accurate as of September 27, 2019  2:04 PM. If you have any questions, ask your nurse or doctor.        fenofibrate 160 MG  tablet Take 160 mg by mouth daily.   levonorgestrel 20 MCG/24HR IUD Commonly known as: MIRENA Mirena 20 mcg/24 hr (5 years) intrauterine device  Take by intrauterine route.   lisinopril 5 MG tablet Commonly known as: ZESTRIL Take 1 tablet by mouth daily.   Minocycline HCl 90 MG Tb24 Take 1 tablet by mouth daily.   omeprazole 20 MG capsule Commonly known as: PRILOSEC Take 1 capsule by mouth daily.   ondansetron 8 MG disintegrating tablet Commonly known as: ZOFRAN-ODT TAKE ONE TABLET (8 MG DOSE) BY MOUTH EVERY 8 (EIGHT) HOURS AS NEEDED FOR NAUSEA FOR UP TO 7 DAYS.   rizatriptan 10 MG tablet Commonly known as: MAXALT Take 10 mg by mouth daily as needed for migraine.   rizatriptan 10 MG tablet Commonly known as: MAXALT rizatriptan 10 mg tablet  TAKE ONE TABLET (10 MG TOTAL) BY MOUTH ONCE AS NEEDED FOR MIGRAINE. MAY REPEAT IN 2 HOURS IF NEEDED        ROS:  A comprehensive review of systems was negative except for: Gastrointestinal: positive for abdominal pain, nausea and reflux symptoms  Blood pressure 135/86, pulse 76, temperature 98.4 F (36.9 C),  temperature source Oral, resp. rate 16, height 5\' 4"  (1.626 m), weight 231 lb (104.8 kg), SpO2 96 %. Physical Exam Vitals signs reviewed.  Constitutional:      Appearance: She is well-developed.  HENT:     Head: Normocephalic and atraumatic.  Eyes:     Extraocular Movements: Extraocular movements intact.  Cardiovascular:     Rate and Rhythm: Normal rate and regular rhythm.  Pulmonary:     Effort: Pulmonary effort is normal.     Breath sounds: Normal breath sounds.  Abdominal:     General: Abdomen is flat. There is no distension.     Palpations: Abdomen is soft.     Tenderness: There is abdominal tenderness in the right upper quadrant and epigastric area.  Skin:    General: Skin is warm and dry.  Neurological:     General: No focal deficit present.     Mental Status: She is alert and oriented to person,  place, and time.  Psychiatric:        Mood and Affect: Mood normal.        Behavior: Behavior normal.     Results: RUQ 07/2019 CLINICAL DATA: Epigastric region pain  EXAM: ULTRASOUND ABDOMEN LIMITED RIGHT UPPER QUADRANT  COMPARISON: None.  FINDINGS: Gallbladder:  Within the gallbladder, there are echogenic foci which move and shadow consistent with cholelithiasis. Largest gallstone measures 1.1 cm in length. There is no gallbladder wall thickening or pericholecystic fluid. No sonographic Murphy sign noted by sonographer.  Common bile duct:  Diameter: 4 mm. There is no intrahepatic or extrahepatic biliary duct dilatation.  Liver:  No focal lesion identified. Liver echogenicity is increased diffusely. Portal vein is patent on color Doppler imaging with normal direction of blood flow towards the liver.  Other: None.  IMPRESSION: 1. Cholelithiasis. No evident gallbladder wall thickening or pericholecystic fluid.  2. Diffuse increase in liver echogenicity, a finding indicative of hepatic steatosis. While no focal liver lesions are evident on this study, it must be cautioned that the sensitivity of ultrasound for detection of focal liver lesions is diminished in this circumstance.  Assessment & Plan:  Barbara Jefferson is a 38 y.o. female with symptomatic cholelithiasis that has been causing her more issues. She is ready to have her gallbladder removed.   -PLAN: I counseled the patient about the indication, risks and benefits of laparoscopic cholecystectomy.  She understands there is a very small chance for bleeding, infection, injury to normal structures (including common bile duct), conversion to open surgery, persistent symptoms, evolution of postcholecystectomy diarrhea, need for secondary interventions, anesthesia reaction, cardiopulmonary issues and other risks not specifically detailed here. I described the expected recovery, the plan for follow-up and  the restrictions during the recovery phase.  All questions were answered. 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   All questions were answered to the satisfaction of the patient.    Virl Cagey 09/27/2019, 2:04 PM

## 2019-09-27 NOTE — Progress Notes (Signed)
[facility] History and Physical  Reason for Referral: Gallstones  Referring Physician: Bridget Hartshorn, NP   Chief Complaint    Abdominal Pain      Barbara Jefferson is a 38 y.o. female.  HPI:  Barbara Jefferson is a 38 yo who has had years of epigastric and RUQ pain and even reports taking to Dr. Donne Hazel about 10 years ago about he gallbladder. At that time, it was thought she was not having severe enough symptoms to get her gallbladder. Over the past several weeks she has described epigastric pain that radiates to her right under her rib cage and extending into her back. She says that this occurs randomly but it could be related to greasy foods. She says that she has nausea/bloating but no vomiting. She also has been having some diarrhea.  She otherwise is relatively healthy. She does take some propanolol for her HTN and this also helps her migraines.   Past Medical History:  Diagnosis Date  . Hidradenitis suppurativa   . Hyperlipidemia   . Hypertension   . Migraine   . OSA (obstructive sleep apnea) 05/28/2018  . Psoriasis   . PVC (premature ventricular contraction)     Past Surgical History:  Procedure Laterality Date  . CESAREAN SECTION  08/26/05    Family History  Problem Relation Age of Onset  . Stroke Mother   . Diabetes Father   . Hypertension Father   . Hypertension Brother     Social History   Tobacco Use  . Smoking status: Current Every Day Smoker    Packs/day: 0.50    Years: 22.00    Pack years: 11.00  . Smokeless tobacco: Never Used  Substance Use Topics  . Alcohol use: No  . Drug use: No    Medications: I have reviewed the patient's current medications. Allergies as of 09/27/2019      Reactions   Latex Rash      Medication List       Accurate as of September 27, 2019  2:04 PM. If you have any questions, ask your nurse or doctor.        fenofibrate 160 MG tablet Take 160 mg by mouth daily.   levonorgestrel 20 MCG/24HR IUD  Commonly known as: MIRENA Mirena 20 mcg/24 hr (5 years) intrauterine device  Take by intrauterine route.   lisinopril 5 MG tablet Commonly known as: ZESTRIL Take 1 tablet by mouth daily.   Minocycline HCl 90 MG Tb24 Take 1 tablet by mouth daily.   omeprazole 20 MG capsule Commonly known as: PRILOSEC Take 1 capsule by mouth daily.   ondansetron 8 MG disintegrating tablet Commonly known as: ZOFRAN-ODT TAKE ONE TABLET (8 MG DOSE) BY MOUTH EVERY 8 (EIGHT) HOURS AS NEEDED FOR NAUSEA FOR UP TO 7 DAYS.   rizatriptan 10 MG tablet Commonly known as: MAXALT Take 10 mg by mouth daily as needed for migraine.   rizatriptan 10 MG tablet Commonly known as: MAXALT rizatriptan 10 mg tablet  TAKE ONE TABLET (10 MG TOTAL) BY MOUTH ONCE AS NEEDED FOR MIGRAINE. MAY REPEAT IN 2 HOURS IF NEEDED        ROS:  A comprehensive review of systems was negative except for: Gastrointestinal: positive for abdominal pain, nausea and reflux symptoms  Blood pressure 135/86, pulse 76, temperature 98.4 F (36.9 C), temperature source Oral, resp. rate 16, height 5\' 4"  (1.626 m), weight 231 lb (104.8 kg), SpO2 96 %. Physical Exam Vitals signs reviewed.  Constitutional:  Appearance: She is well-developed.  HENT:     Head: Normocephalic and atraumatic.  Eyes:     Extraocular Movements: Extraocular movements intact.  Cardiovascular:     Rate and Rhythm: Normal rate and regular rhythm.  Pulmonary:     Effort: Pulmonary effort is normal.     Breath sounds: Normal breath sounds.  Abdominal:     General: Abdomen is flat. There is no distension.     Palpations: Abdomen is soft.     Tenderness: There is abdominal tenderness in the right upper quadrant and epigastric area.  Skin:    General: Skin is warm and dry.  Neurological:     General: No focal deficit present.     Mental Status: She is alert and oriented to person, place, and time.  Psychiatric:        Mood and Affect: Mood normal.         Behavior: Behavior normal.     Results: RUQ 07/2019 CLINICAL DATA:  Epigastric region pain  EXAM: ULTRASOUND ABDOMEN LIMITED RIGHT UPPER QUADRANT  COMPARISON:  None.  FINDINGS: Gallbladder:  Within the gallbladder, there are echogenic foci which move and shadow consistent with cholelithiasis. Largest gallstone measures 1.1 cm in length. There is no gallbladder wall thickening or pericholecystic fluid. No sonographic Murphy sign noted by sonographer.  Common bile duct:  Diameter: 4 mm. There is no intrahepatic or extrahepatic biliary duct dilatation.  Liver:  No focal lesion identified. Liver echogenicity is increased diffusely. Portal vein is patent on color Doppler imaging with normal direction of blood flow towards the liver.  Other: None.  IMPRESSION: 1. Cholelithiasis. No evident gallbladder wall thickening or pericholecystic fluid.  2. Diffuse increase in liver echogenicity, a finding indicative of hepatic steatosis. While no focal liver lesions are evident on this study, it must be cautioned that the sensitivity of ultrasound for detection of focal liver lesions is diminished in this circumstance.  Assessment & Plan:  Barbara Jefferson is a 38 y.o. female with symptomatic cholelithiasis that has been causing her more issues. She is ready to have her gallbladder removed.   -PLAN: I counseled the patient about the indication, risks and benefits of laparoscopic cholecystectomy.  She understands there is a very small chance for bleeding, infection, injury to normal structures (including common bile duct), conversion to open surgery, persistent symptoms, evolution of postcholecystectomy diarrhea, need for secondary interventions, anesthesia reaction, cardiopulmonary issues and other risks not specifically detailed here. I described the expected recovery, the plan for follow-up and the restrictions during the recovery phase.  All questions were answered.  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   All questions were answered to the satisfaction of the patient.    Virl Cagey 09/27/2019, 2:04 PM

## 2019-09-27 NOTE — Patient Instructions (Signed)
Cholelithiasis  Cholelithiasis is also called "gallstones." It is a kind of gallbladder disease. The gallbladder is an organ that stores a liquid (bile) that helps you digest fat. Gallstones may not cause symptoms (may be silent gallstones) until they cause a blockage, and then they can cause pain (gallbladder attack). Follow these instructions at home:  Take over-the-counter and prescription medicines only as told by your doctor.  Stay at a healthy weight.  Eat healthy foods. This includes: ? Eating fewer fatty foods, like fried foods. ? Eating fewer refined carbs (refined carbohydrates). Refined carbs are breads and grains that are highly processed, like white bread and white rice. Instead, choose whole grains like whole-wheat bread and brown rice. ? Eating more fiber. Almonds, fresh fruit, and beans are healthy sources of fiber.  Keep all follow-up visits as told by your doctor. This is important. Contact a doctor if:  You have sudden pain in the upper right side of your belly (abdomen). Pain might spread to your right shoulder or your chest. This may be a sign of a gallbladder attack.  You feel sick to your stomach (are nauseous).  You throw up (vomit).  You have been diagnosed with gallstones that have no symptoms and you get: ? Belly pain. ? Discomfort, burning, or fullness in the upper part of your belly (indigestion). Get help right away if:  You have sudden pain in the upper right side of your belly, and it lasts for more than 2 hours.  You have belly pain that lasts for more than 5 hours.  You have a fever or chills.  You keep feeling sick to your stomach or you keep throwing up.  Your skin or the whites of your eyes turn yellow (jaundice).  You have dark-colored pee (urine).  You have light-colored poop (stool). Summary  Cholelithiasis is also called "gallstones."  The gallbladder is an organ that stores a liquid (bile) that helps you digest fat.  Silent  gallstones are gallstones that do not cause symptoms.  A gallbladder attack may cause sudden pain in the upper right side of your belly. Pain might spread to your right shoulder or your chest. If this happens, contact your doctor.  If you have sudden pain in the upper right side of your belly that lasts for more than 2 hours, get help right away. This information is not intended to replace advice given to you by your health care provider. Make sure you discuss any questions you have with your health care provider. Document Released: 04/07/2008 Document Revised: 10/02/2017 Document Reviewed: 07/06/2016 Elsevier Patient Education  2020 [employer].   Laparoscopic Cholecystectomy Laparoscopic cholecystectomy is surgery to remove the gallbladder. The gallbladder is a pear-shaped organ that lies beneath the liver on the right side of the body. The gallbladder stores bile, which is a fluid that helps the body to digest fats. Cholecystectomy is often done for inflammation of the gallbladder (cholecystitis). This condition is usually caused by a buildup of gallstones (cholelithiasis) in the gallbladder. Gallstones can block the flow of bile, which can result in inflammation and pain. In severe cases, emergency surgery may be required. This procedure is done though small incisions in your abdomen (laparoscopic surgery). A thin scope with a camera (laparoscope) is inserted through one incision. Thin surgical instruments are inserted through the other incisions. In some cases, a laparoscopic procedure may be turned into a type of surgery that is done through a larger incision (open surgery). Tell a health care provider about:  Any allergies you have.  All medicines you are taking, including vitamins, herbs, eye drops, creams, and over-the-counter medicines.  Any problems you or family members have had with anesthetic medicines.  Any blood disorders you have.  Any surgeries you have had.  Any medical  conditions you have.  Whether you are pregnant or may be pregnant. What are the risks? Generally, this is a safe procedure. However, problems may occur, including:  Infection.  Bleeding.  Allergic reactions to medicines.  Damage to other structures or organs.  A stone remaining in the common bile duct. The common bile duct carries bile from the gallbladder into the small intestine.  A bile leak from the cyst duct that is clipped when your gallbladder is removed. Medicines  Ask your health care provider about: ? Changing or stopping your regular medicines. This is especially important if you are taking diabetes medicines or blood thinners. ? Taking medicines such as aspirin and ibuprofen. These medicines can thin your blood. Do not take these medicines before your procedure if your health care provider instructs you not to.  You may be given antibiotic medicine to help prevent infection. General instructions  Let your health care provider know if you develop a cold or an infection before surgery.  Plan to have someone take you home from the hospital or clinic.  Ask your health care provider how your surgical site will be marked or identified. What happens during the procedure?   To reduce your risk of infection: ? Your health care team will wash or sanitize their hands. ? Your skin will be washed with soap. ? Hair may be removed from the surgical area.  An IV tube may be inserted into one of your veins.  You will be given one or more of the following: ? A medicine to help you relax (sedative). ? A medicine to make you fall asleep (general anesthetic).  A breathing tube will be placed in your mouth.  Your surgeon will make several small cuts (incisions) in your abdomen.  The laparoscope will be inserted through one of the small incisions. The camera on the laparoscope will send images to a TV screen (monitor) in the operating room. This lets your surgeon see inside your  abdomen.  Air-like gas will be pumped into your abdomen. This will expand your abdomen to give the surgeon more room to perform the surgery.  Other tools that are needed for the procedure will be inserted through the other incisions. The gallbladder will be removed through one of the incisions.  Your common bile duct may be examined. If stones are found in the common bile duct, they may be removed.  After your gallbladder has been removed, the incisions will be closed with stitches (sutures), staples, or skin glue.  Your incisions may be covered with a bandage (dressing). The procedure may vary among health care providers and hospitals. What happens after the procedure?  Your blood pressure, heart rate, breathing rate, and blood oxygen level will be monitored until the medicines you were given have worn off.  You will be given medicines as needed to control your pain.  Do not drive for 24 hours if you were given a sedative. This information is not intended to replace advice given to you by your health care provider. Make sure you discuss any questions you have with your health care provider. Document Released: 10/20/2005 Document Revised: 10/02/2017 Document Reviewed: 04/07/2016 Elsevier Patient Education  2020 [employer].

## 2019-10-07 NOTE — Patient Instructions (Signed)
Barbara Jefferson  10/07/2019     @PREFPERIOPPHARMACY @   Your procedure is scheduled on  10/12/2019 .  Report to [facility] at  [phone]  A.M.  Call this number if you have problems the morning of surgery:  [phone]   Remember:  Do not eat or drink after midnight.                          Take these medicines the morning of surgery with A SIP OF WATER  Lexapro, prilosec,zofran(if needed), propranolol, maxalt(if needed).    Do not wear jewelry, make-up or nail polish.  Do not wear lotions, powders, or perfumes. Please wear deodorant and brush your teeth.  Do not shave 48 hours prior to surgery.  Men may shave face and neck.  Do not bring valuables to the hospital.  [facility] is not responsible for any belongings or valuables.  Contacts, dentures or bridgework may not be worn into surgery.  Leave your suitcase in the car.  After surgery it may be brought to your room.  For patients admitted to the hospital, discharge time will be determined by your treatment team.  Patients discharged the day of surgery will not be allowed to drive home.   Name and phone number of your driver:   Family Special instructions:  None  Please read over the following fact sheets that you were given. Anesthesia Post-op Instructions and Care and Recovery After Surgery       Laparoscopic Cholecystectomy, Care After This sheet gives you information about how to care for yourself after your procedure. Your health care provider may also give you more specific instructions. If you have problems or questions, contact your health care provider. What can I expect after the procedure? After the procedure, it is common to have:  Pain at your incision sites. You will be given medicines to control this pain.  Mild nausea or vomiting.  Bloating and possible shoulder pain from the air-like gas that was used during the procedure. Follow these instructions at home: Incision care   Follow  instructions from your health care provider about how to take care of your incisions. Make sure you: ? Wash your hands with soap and water before you change your bandage (dressing). If soap and water are not available, use hand sanitizer. ? Change your dressing as told by your health care provider. ? Leave stitches (sutures), skin glue, or adhesive strips in place. These skin closures may need to be in place for 2 weeks or longer. If adhesive strip edges start to loosen and curl up, you may trim the loose edges. Do not remove adhesive strips completely unless your health care provider tells you to do that.  Do not take baths, swim, or use a hot tub until your health care provider approves. Ask your health care provider if you can take showers. You may only be allowed to take sponge baths for bathing.  Check your incision area every day for signs of infection. Check for: ? More redness, swelling, or pain. ? More fluid or blood. ? Warmth. ? Pus or a bad smell. Activity  Do not drive or use heavy machinery while taking prescription pain medicine.  Do not lift anything that is heavier than 10 lb (4.5 kg) until your health care provider approves.  Do not play contact sports until your health care provider approves.  Do not drive for 24  hours if you were given a medicine to help you relax (sedative).  Rest as needed. Do not return to work or school until your health care provider approves. General instructions  Take over-the-counter and prescription medicines only as told by your health care provider.  To prevent or treat constipation while you are taking prescription pain medicine, your health care provider may recommend that you: ? Drink enough fluid to keep your urine clear or pale yellow. ? Take over-the-counter or prescription medicines. ? Eat foods that are high in fiber, such as fresh fruits and vegetables, whole grains, and beans. ? Limit foods that are high in fat and processed  sugars, such as fried and sweet foods. Contact a health care provider if:  You develop a rash.  You have more redness, swelling, or pain around your incisions.  You have more fluid or blood coming from your incisions.  Your incisions feel warm to the touch.  You have pus or a bad smell coming from your incisions.  You have a fever.  One or more of your incisions breaks open. Get help right away if:  You have trouble breathing.  You have chest pain.  You have increasing pain in your shoulders.  You faint or feel dizzy when you stand.  You have severe pain in your abdomen.  You have nausea or vomiting that lasts for more than one day.  You have leg pain. This information is not intended to replace advice given to you by your health care provider. Make sure you discuss any questions you have with your health care provider. Document Released: 10/20/2005 Document Revised: 10/02/2017 Document Reviewed: 04/07/2016 Elsevier Patient Education  2020 Comfort Anesthesia, Adult, Care After This sheet gives you information about how to care for yourself after your procedure. Your health care provider may also give you more specific instructions. If you have problems or questions, contact your health care provider. What can I expect after the procedure? After the procedure, the following side effects are common:  Pain or discomfort at the IV site.  Nausea.  Vomiting.  Sore throat.  Trouble concentrating.  Feeling cold or chills.  Weak or tired.  Sleepiness and fatigue.  Soreness and body aches. These side effects can affect parts of the body that were not involved in surgery. Follow these instructions at home:  For at least 24 hours after the procedure:  Have a responsible adult stay with you. It is important to have someone help care for you until you are awake and alert.  Rest as needed.  Do not: ? Participate in activities in which you could fall  or become injured. ? Drive. ? Use heavy machinery. ? Drink alcohol. ? Take sleeping pills or medicines that cause drowsiness. ? Make important decisions or sign legal documents. ? Take care of children on your own. Eating and drinking  Follow any instructions from your health care provider about eating or drinking restrictions.  When you feel hungry, start by eating small amounts of foods that are soft and easy to digest (bland), such as toast. Gradually return to your regular diet.  Drink enough fluid to keep your urine pale yellow.  If you vomit, rehydrate by drinking water, juice, or clear broth. General instructions  If you have sleep apnea, surgery and certain medicines can increase your risk for breathing problems. Follow instructions from your health care provider about wearing your sleep device: ? Anytime you are sleeping, including during daytime naps. ?  While taking prescription pain medicines, sleeping medicines, or medicines that make you drowsy.  Return to your normal activities as told by your health care provider. Ask your health care provider what activities are safe for you.  Take over-the-counter and prescription medicines only as told by your health care provider.  If you smoke, do not smoke without supervision.  Keep all follow-up visits as told by your health care provider. This is important. Contact a health care provider if:  You have nausea or vomiting that does not get better with medicine.  You cannot eat or drink without vomiting.  You have pain that does not get better with medicine.  You are unable to pass urine.  You develop a skin rash.  You have a fever.  You have redness around your IV site that gets worse. Get help right away if:  You have difficulty breathing.  You have chest pain.  You have blood in your urine or stool, or you vomit blood. Summary  After the procedure, it is common to have a sore throat or nausea. It is also  common to feel tired.  Have a responsible adult stay with you for the first 24 hours after general anesthesia. It is important to have someone help care for you until you are awake and alert.  When you feel hungry, start by eating small amounts of foods that are soft and easy to digest (bland), such as toast. Gradually return to your regular diet.  Drink enough fluid to keep your urine pale yellow.  Return to your normal activities as told by your health care provider. Ask your health care provider what activities are safe for you. This information is not intended to replace advice given to you by your health care provider. Make sure you discuss any questions you have with your health care provider. Document Released: 01/26/2001 Document Revised: 10/23/2017 Document Reviewed: 06/05/2017 Elsevier Patient Education  2020 [employer]. How to Use Chlorhexidine for Bathing Chlorhexidine gluconate (CHG) is a germ-killing (antiseptic) solution that is used to clean the skin. It can get rid of the bacteria that normally live on the skin and can keep them away for about 24 hours. To clean your skin with CHG, you may be given:  A CHG solution to use in the shower or as part of a sponge bath.  A prepackaged cloth that contains CHG. Cleaning your skin with CHG may help lower the risk for infection:  While you are staying in the intensive care unit of the hospital.  If you have a vascular access, such as a central line, to provide short-term or long-term access to your veins.  If you have a catheter to drain urine from your bladder.  If you are on a ventilator. A ventilator is a machine that helps you breathe by moving air in and out of your lungs.  After surgery. What are the risks? Risks of using CHG include:  A skin reaction.  Hearing loss, if CHG gets in your ears.  Eye injury, if CHG gets in your eyes and is not rinsed out.  The CHG product catching fire. Make sure that you avoid  smoking and flames after applying CHG to your skin. Do not use CHG:  If you have a chlorhexidine allergy or have previously reacted to chlorhexidine.  On babies younger than 94 months of age. How to use CHG solution  Use CHG only as told by your health care provider, and follow the instructions on the  label.  Use the full amount of CHG as directed. Usually, this is one bottle. During a shower Follow these steps when using CHG solution during a shower (unless your health care provider gives you different instructions): 1. Start the shower. 2. Use your normal soap and shampoo to wash your face and hair. 3. Turn off the shower or move out of the shower stream. 4. Pour the CHG onto a clean washcloth. Do not use any type of brush or rough-edged sponge. 5. Starting at your neck, lather your body down to your toes. Make sure you follow these instructions: ? If you will be having surgery, pay special attention to the part of your body where you will be having surgery. Scrub this area for at least 1 minute. ? Do not use CHG on your head or face. If the solution gets into your ears or eyes, rinse them well with water. ? Avoid your genital area. ? Avoid any areas of skin that have broken skin, cuts, or scrapes. ? Scrub your back and under your arms. Make sure to wash skin folds. 6. Let the lather sit on your skin for 1-2 minutes or as long as told by your health care provider. 7. Thoroughly rinse your entire body in the shower. Make sure that all body creases and crevices are rinsed well. 8. Dry off with a clean towel. Do not put any substances on your body afterward-such as powder, lotion, or perfume-unless you are told to do so by your health care provider. Only use lotions that are recommended by the manufacturer. 9. Put on clean clothes or pajamas. 10. If it is the night before your surgery, sleep in clean sheets.  During a sponge bath Follow these steps when using CHG solution during a sponge  bath (unless your health care provider gives you different instructions): 1. Use your normal soap and shampoo to wash your face and hair. 2. Pour the CHG onto a clean washcloth. 3. Starting at your neck, lather your body down to your toes. Make sure you follow these instructions: ? If you will be having surgery, pay special attention to the part of your body where you will be having surgery. Scrub this area for at least 1 minute. ? Do not use CHG on your head or face. If the solution gets into your ears or eyes, rinse them well with water. ? Avoid your genital area. ? Avoid any areas of skin that have broken skin, cuts, or scrapes. ? Scrub your back and under your arms. Make sure to wash skin folds. 4. Let the lather sit on your skin for 1-2 minutes or as long as told by your health care provider. 5. Using a different clean, wet washcloth, thoroughly rinse your entire body. Make sure that all body creases and crevices are rinsed well. 6. Dry off with a clean towel. Do not put any substances on your body afterward-such as powder, lotion, or perfume-unless you are told to do so by your health care provider. Only use lotions that are recommended by the manufacturer. 7. Put on clean clothes or pajamas. 8. If it is the night before your surgery, sleep in clean sheets. How to use CHG prepackaged cloths  Only use CHG cloths as told by your health care provider, and follow the instructions on the label.  Use the CHG cloth on clean, dry skin.  Do not use the CHG cloth on your head or face unless your health care provider tells you to.  When washing with the CHG cloth: ? Avoid your genital area. ? Avoid any areas of skin that have broken skin, cuts, or scrapes. Before surgery Follow these steps when using a CHG cloth to clean before surgery (unless your health care provider gives you different instructions): 1. Using the CHG cloth, vigorously scrub the part of your body where you will be having  surgery. Scrub using a back-and-forth motion for 3 minutes. The area on your body should be completely wet with CHG when you are done scrubbing. 2. Do not rinse. Discard the cloth and let the area air-dry. Do not put any substances on the area afterward, such as powder, lotion, or perfume. 3. Put on clean clothes or pajamas. 4. If it is the night before your surgery, sleep in clean sheets.  For general bathing Follow these steps when using CHG cloths for general bathing (unless your health care provider gives you different instructions). 1. Use a separate CHG cloth for each area of your body. Make sure you wash between any folds of skin and between your fingers and toes. Wash your body in the following order, switching to a new cloth after each step: ? The front of your neck, shoulders, and chest. ? Both of your arms, under your arms, and your hands. ? Your stomach and groin area, avoiding the genitals. ? Your right leg and foot. ? Your left leg and foot. ? The back of your neck, your back, and your buttocks. 2. Do not rinse. Discard the cloth and let the area air-dry. Do not put any substances on your body afterward-such as powder, lotion, or perfume-unless you are told to do so by your health care provider. Only use lotions that are recommended by the manufacturer. 3. Put on clean clothes or pajamas. Contact a health care provider if:  Your skin gets irritated after scrubbing.  You have questions about using your solution or cloth. Get help right away if:  Your eyes become very red or swollen.  Your eyes itch badly.  Your skin itches badly and is red or swollen.  Your hearing changes.  You have trouble seeing.  You have swelling or tingling in your mouth or throat.  You have trouble breathing.  You swallow any chlorhexidine. Summary  Chlorhexidine gluconate (CHG) is a germ-killing (antiseptic) solution that is used to clean the skin. Cleaning your skin with CHG may help to  lower your risk for infection.  You may be given CHG to use for bathing. It may be in a bottle or in a prepackaged cloth to use on your skin. Carefully follow your health care provider's instructions and the instructions on the product label.  Do not use CHG if you have a chlorhexidine allergy.  Contact your health care provider if your skin gets irritated after scrubbing. This information is not intended to replace advice given to you by your health care provider. Make sure you discuss any questions you have with your health care provider. Document Released: 07/14/2012 Document Revised: 01/06/2019 Document Reviewed: 09/17/2017 Elsevier Patient Education  2020 [employer].

## 2019-10-10 ENCOUNTER — Encounter (HOSPITAL_COMMUNITY): Payer: Self-pay

## 2019-10-10 ENCOUNTER — Encounter (HOSPITAL_COMMUNITY)
Admission: RE | Admit: 2019-10-10 | Discharge: 2019-10-10 | Disposition: A | Discharge status: Home / Self Care | Payer: Managed Care, Other (non HMO) | Source: Ambulatory Visit | Attending: General Surgery | Admitting: General Surgery

## 2019-10-10 ENCOUNTER — Other Ambulatory Visit: Payer: Self-pay

## 2019-10-10 DIAGNOSIS — Z01812 Encounter for preprocedural laboratory examination: Secondary | ICD-10-CM | POA: Diagnosis not present

## 2019-10-10 HISTORY — DX: Gastro-esophageal reflux disease without esophagitis: K21.9

## 2019-10-10 HISTORY — DX: Anxiety disorder, unspecified: F41.9

## 2019-10-10 LAB — CBC WITH DIFFERENTIAL/PLATELET
Abs Immature Granulocytes: 0.04 10*3/uL (ref 0.00–0.07)
Basophils Absolute: 0.1 10*3/uL (ref 0.0–0.1)
Basophils Relative: 1 %
Eosinophils Absolute: 0.2 10*3/uL (ref 0.0–0.5)
Eosinophils Relative: 2 %
HCT: 38.4 % (ref 36.0–46.0)
Hemoglobin: 12.6 g/dL (ref 12.0–15.0)
Immature Granulocytes: 0 %
Lymphocytes Relative: 38 %
Lymphs Abs: 3.9 10*3/uL (ref 0.7–4.0)
MCH: 30.4 pg (ref 26.0–34.0)
MCHC: 32.8 g/dL (ref 30.0–36.0)
MCV: 92.5 fL (ref 80.0–100.0)
Monocytes Absolute: 0.6 10*3/uL (ref 0.1–1.0)
Monocytes Relative: 6 %
Neutro Abs: 5.4 10*3/uL (ref 1.7–7.7)
Neutrophils Relative %: 53 %
Platelets: 250 10*3/uL (ref 150–400)
RBC: 4.15 MIL/uL (ref 3.87–5.11)
RDW: 13.3 % (ref 11.5–15.5)
WBC: 10.3 10*3/uL (ref 4.0–10.5)
nRBC: 0 % (ref 0.0–0.2)

## 2019-10-10 LAB — COMPREHENSIVE METABOLIC PANEL
ALT: 30 U/L (ref 0–44)
AST: 22 U/L (ref 15–41)
Albumin: 3.6 g/dL (ref 3.5–5.0)
Alkaline Phosphatase: 47 U/L (ref 38–126)
Anion gap: 11 (ref 5–15)
BUN: 9 mg/dL (ref 6–20)
CO2: 20 mmol/L — ABNORMAL LOW (ref 22–32)
Calcium: 9.3 mg/dL (ref 8.9–10.3)
Chloride: 104 mmol/L (ref 98–111)
Creatinine, Ser: 0.41 mg/dL — ABNORMAL LOW (ref 0.44–1.00)
GFR calc Af Amer: >60 mL/min (ref >60)
GFR calc non Af Amer: >60 mL/min (ref >60)
Glucose, Bld: 210 mg/dL — ABNORMAL HIGH (ref 70–99)
Potassium: 3.6 mmol/L (ref 3.5–5.1)
Sodium: 135 mmol/L (ref 135–145)
Total Bilirubin: 0.4 mg/dL (ref 0.3–1.2)
Total Protein: 7.1 g/dL (ref 6.5–8.1)

## 2019-10-10 LAB — HCG, SERUM, QUALITATIVE: Preg, Serum: NEGATIVE

## 2019-10-11 ENCOUNTER — Other Ambulatory Visit (HOSPITAL_COMMUNITY)
Admission: RE | Admit: 2019-10-11 | Discharge: 2019-10-11 | Disposition: A | Discharge status: Home / Self Care | Payer: Managed Care, Other (non HMO) | Source: Ambulatory Visit | Attending: General Surgery | Admitting: General Surgery

## 2019-10-11 DIAGNOSIS — Z01812 Encounter for preprocedural laboratory examination: Secondary | ICD-10-CM | POA: Insufficient documentation

## 2019-10-11 DIAGNOSIS — Z20828 Contact with and (suspected) exposure to other viral communicable diseases: Secondary | ICD-10-CM | POA: Insufficient documentation

## 2019-10-11 LAB — SARS CORONAVIRUS 2 (TAT 6-24 HRS): SARS Coronavirus 2: NEGATIVE

## 2019-10-12 ENCOUNTER — Encounter (HOSPITAL_COMMUNITY): Payer: Self-pay | Admitting: *Deleted

## 2019-10-12 ENCOUNTER — Ambulatory Visit (HOSPITAL_COMMUNITY): Payer: Managed Care, Other (non HMO) | Admitting: Anesthesiology

## 2019-10-12 ENCOUNTER — Other Ambulatory Visit: Payer: Self-pay

## 2019-10-12 ENCOUNTER — Ambulatory Visit (HOSPITAL_COMMUNITY)
Admission: RE | Admit: 2019-10-12 | Discharge: 2019-10-12 | Disposition: A | Discharge status: Home / Self Care | Payer: Managed Care, Other (non HMO) | Source: Ambulatory Visit | Attending: General Surgery | Admitting: General Surgery

## 2019-10-12 ENCOUNTER — Encounter (HOSPITAL_COMMUNITY)
Admission: RE | Disposition: A | Discharge status: Home / Self Care | Payer: Self-pay | Source: Ambulatory Visit | Attending: General Surgery

## 2019-10-12 DIAGNOSIS — Z793 Long term (current) use of hormonal contraceptives: Secondary | ICD-10-CM | POA: Diagnosis not present

## 2019-10-12 DIAGNOSIS — E785 Hyperlipidemia, unspecified: Secondary | ICD-10-CM | POA: Diagnosis not present

## 2019-10-12 DIAGNOSIS — K828 Other specified diseases of gallbladder: Secondary | ICD-10-CM | POA: Insufficient documentation

## 2019-10-12 DIAGNOSIS — Z79899 Other long term (current) drug therapy: Secondary | ICD-10-CM | POA: Insufficient documentation

## 2019-10-12 DIAGNOSIS — Z823 Family history of stroke: Secondary | ICD-10-CM | POA: Insufficient documentation

## 2019-10-12 DIAGNOSIS — K810 Acute cholecystitis: Secondary | ICD-10-CM | POA: Diagnosis not present

## 2019-10-12 DIAGNOSIS — I10 Essential (primary) hypertension: Secondary | ICD-10-CM | POA: Insufficient documentation

## 2019-10-12 DIAGNOSIS — F17200 Nicotine dependence, unspecified, uncomplicated: Secondary | ICD-10-CM | POA: Diagnosis not present

## 2019-10-12 DIAGNOSIS — F419 Anxiety disorder, unspecified: Secondary | ICD-10-CM | POA: Insufficient documentation

## 2019-10-12 DIAGNOSIS — K219 Gastro-esophageal reflux disease without esophagitis: Secondary | ICD-10-CM | POA: Insufficient documentation

## 2019-10-12 DIAGNOSIS — G43909 Migraine, unspecified, not intractable, without status migrainosus: Secondary | ICD-10-CM | POA: Diagnosis not present

## 2019-10-12 DIAGNOSIS — L409 Psoriasis, unspecified: Secondary | ICD-10-CM | POA: Insufficient documentation

## 2019-10-12 DIAGNOSIS — K8020 Calculus of gallbladder without cholecystitis without obstruction: Secondary | ICD-10-CM

## 2019-10-12 DIAGNOSIS — G4733 Obstructive sleep apnea (adult) (pediatric): Secondary | ICD-10-CM | POA: Insufficient documentation

## 2019-10-12 DIAGNOSIS — K760 Fatty (change of) liver, not elsewhere classified: Secondary | ICD-10-CM | POA: Insufficient documentation

## 2019-10-12 DIAGNOSIS — Z6839 Body mass index (BMI) 39.0-39.9, adult: Secondary | ICD-10-CM | POA: Diagnosis not present

## 2019-10-12 DIAGNOSIS — Z833 Family history of diabetes mellitus: Secondary | ICD-10-CM | POA: Insufficient documentation

## 2019-10-12 DIAGNOSIS — K8010 Calculus of gallbladder with chronic cholecystitis without obstruction: Secondary | ICD-10-CM | POA: Insufficient documentation

## 2019-10-12 DIAGNOSIS — Z8249 Family history of ischemic heart disease and other diseases of the circulatory system: Secondary | ICD-10-CM | POA: Insufficient documentation

## 2019-10-12 HISTORY — PX: CHOLECYSTECTOMY: SHX55

## 2019-10-12 LAB — GLUCOSE, CAPILLARY: Glucose-Capillary: 143 mg/dL — ABNORMAL HIGH (ref 70–99)

## 2019-10-12 SURGERY — LAPAROSCOPIC CHOLECYSTECTOMY
Anesthesia: General

## 2019-10-12 MED ORDER — PROPOFOL 10 MG/ML IV BOLUS
INTRAVENOUS | Status: AC
  Filled 2019-10-12: qty 20

## 2019-10-12 MED ORDER — BUPIVACAINE HCL (PF) 0.5 % IJ SOLN
INTRAMUSCULAR | Status: DC | PRN
  Administered 2019-10-12: 30 mL

## 2019-10-12 MED ORDER — GLYCOPYRROLATE PF 0.2 MG/ML IJ SOSY
PREFILLED_SYRINGE | INTRAMUSCULAR | Status: AC
  Filled 2019-10-12: qty 1

## 2019-10-12 MED ORDER — LIDOCAINE 2% (20 MG/ML) 5 ML SYRINGE
INTRAMUSCULAR | Status: AC
  Filled 2019-10-12: qty 5

## 2019-10-12 MED ORDER — SODIUM CHLORIDE 0.9 % IR SOLN
Status: DC | PRN
  Administered 2019-10-12: 1000 mL

## 2019-10-12 MED ORDER — ARTIFICIAL TEARS OPHTHALMIC OINT
TOPICAL_OINTMENT | OPHTHALMIC | Status: AC
  Filled 2019-10-12: qty 3.5

## 2019-10-12 MED ORDER — OXYCODONE HCL 5 MG PO TABS: 5.0000 mg | ORAL_TABLET | ORAL | 0 refills | Status: AC | PRN

## 2019-10-12 MED ORDER — PROPOFOL 10 MG/ML IV BOLUS
INTRAVENOUS | Status: DC | PRN
  Administered 2019-10-12: 200 mg via INTRAVENOUS

## 2019-10-12 MED ORDER — DEXAMETHASONE SODIUM PHOSPHATE 4 MG/ML IJ SOLN
INTRAMUSCULAR | Status: DC | PRN
  Administered 2019-10-12: 8 mg via INTRAVENOUS

## 2019-10-12 MED ORDER — PROMETHAZINE HCL 25 MG/ML IJ SOLN: 6.2500 mg | INTRAMUSCULAR | Status: DC | PRN

## 2019-10-12 MED ORDER — CHLORHEXIDINE GLUCONATE CLOTH 2 % EX PADS: 6.0000 | MEDICATED_PAD | Freq: Once | CUTANEOUS | Status: DC

## 2019-10-12 MED ORDER — PHENYLEPHRINE 40 MCG/ML (10ML) SYRINGE FOR IV PUSH (FOR BLOOD PRESSURE SUPPORT)
PREFILLED_SYRINGE | INTRAVENOUS | Status: AC
  Filled 2019-10-12: qty 10

## 2019-10-12 MED ORDER — FENTANYL CITRATE (PF) 250 MCG/5ML IJ SOLN
INTRAMUSCULAR | Status: AC
  Filled 2019-10-12: qty 5

## 2019-10-12 MED ORDER — ROCURONIUM BROMIDE 10 MG/ML (PF) SYRINGE
PREFILLED_SYRINGE | INTRAVENOUS | Status: AC
  Filled 2019-10-12: qty 20

## 2019-10-12 MED ORDER — SODIUM CHLORIDE 0.9 % IV SOLN
2.0000 g | INTRAVENOUS | Status: AC
  Administered 2019-10-12: 2 g via INTRAVENOUS
  Filled 2019-10-12: qty 2

## 2019-10-12 MED ORDER — FENTANYL CITRATE (PF) 100 MCG/2ML IJ SOLN
INTRAMUSCULAR | Status: DC | PRN
  Administered 2019-10-12 (×2): 50 ug via INTRAVENOUS
  Administered 2019-10-12: 100 ug via INTRAVENOUS
  Administered 2019-10-12 (×2): 50 ug via INTRAVENOUS

## 2019-10-12 MED ORDER — SUCCINYLCHOLINE CHLORIDE 200 MG/10ML IV SOSY
PREFILLED_SYRINGE | INTRAVENOUS | Status: AC
  Filled 2019-10-12: qty 20

## 2019-10-12 MED ORDER — SCOPOLAMINE 1 MG/3DAYS TD PT72
MEDICATED_PATCH | TRANSDERMAL | Status: AC
  Filled 2019-10-12: qty 1

## 2019-10-12 MED ORDER — ROCURONIUM BROMIDE 100 MG/10ML IV SOLN
INTRAVENOUS | Status: DC | PRN
  Administered 2019-10-12: 20 mg via INTRAVENOUS
  Administered 2019-10-12: 40 mg via INTRAVENOUS

## 2019-10-12 MED ORDER — ONDANSETRON 8 MG PO TBDP: 8.0000 mg | ORAL_TABLET | Freq: Three times a day (TID) | ORAL | 0 refills | Status: AC | PRN

## 2019-10-12 MED ORDER — HEMOSTATIC AGENTS (NO CHARGE) OPTIME
TOPICAL | Status: DC | PRN
  Administered 2019-10-12: 1 via TOPICAL

## 2019-10-12 MED ORDER — LACTATED RINGERS IV SOLN
Freq: Once | INTRAVENOUS | Status: AC
  Administered 2019-10-12: 10:00:00 via INTRAVENOUS

## 2019-10-12 MED ORDER — ONDANSETRON HCL 4 MG/2ML IJ SOLN
INTRAMUSCULAR | Status: DC | PRN
  Administered 2019-10-12: 4 mg via INTRAVENOUS

## 2019-10-12 MED ORDER — SUGAMMADEX SODIUM 200 MG/2ML IV SOLN
INTRAVENOUS | Status: DC | PRN
  Administered 2019-10-12: 208 mg via INTRAVENOUS

## 2019-10-12 MED ORDER — HYDROMORPHONE HCL 1 MG/ML IJ SOLN: 0.2500 mg | INTRAMUSCULAR | Status: DC | PRN

## 2019-10-12 MED ORDER — GLYCOPYRROLATE PF 0.2 MG/ML IJ SOSY
PREFILLED_SYRINGE | INTRAMUSCULAR | Status: DC | PRN
  Administered 2019-10-12: .2 mg via INTRAVENOUS

## 2019-10-12 MED ORDER — SUCCINYLCHOLINE CHLORIDE 20 MG/ML IJ SOLN
INTRAMUSCULAR | Status: DC | PRN
  Administered 2019-10-12: 160 mg via INTRAVENOUS

## 2019-10-12 MED ORDER — BUPIVACAINE HCL (PF) 0.5 % IJ SOLN
INTRAMUSCULAR | Status: AC
  Filled 2019-10-12: qty 30

## 2019-10-12 MED ORDER — LACTATED RINGERS IV SOLN
INTRAVENOUS | Status: DC | PRN
  Administered 2019-10-12 (×2): via INTRAVENOUS

## 2019-10-12 MED ORDER — LIDOCAINE HCL (CARDIAC) PF 100 MG/5ML IV SOSY
PREFILLED_SYRINGE | INTRAVENOUS | Status: DC | PRN
  Administered 2019-10-12: 40 mg via INTRAVENOUS

## 2019-10-12 MED ORDER — SCOPOLAMINE 1 MG/3DAYS TD PT72
1.0000 | MEDICATED_PATCH | TRANSDERMAL | Status: DC
  Administered 2019-10-12: 1.5 mg via TRANSDERMAL

## 2019-10-12 MED ORDER — FENTANYL CITRATE (PF) 100 MCG/2ML IJ SOLN
INTRAMUSCULAR | Status: AC
  Filled 2019-10-12: qty 2

## 2019-10-12 MED ORDER — MEPERIDINE HCL 50 MG/ML IJ SOLN: 6.2500 mg | INTRAMUSCULAR | Status: DC | PRN

## 2019-10-12 MED ORDER — DOCUSATE SODIUM 100 MG PO CAPS: 100.0000 mg | ORAL_CAPSULE | Freq: Two times a day (BID) | ORAL | 2 refills | Status: AC

## 2019-10-12 SURGICAL SUPPLY — 44 items
APPLIER CLIP ROT 10 11.4 M/L (STAPLE) ×2
BAG RETRIEVAL 10 (BASKET) ×1
BLADE SURG 15 STRL LF DISP TIS (BLADE) ×1 IMPLANT
BLADE SURG 15 STRL SS (BLADE) ×1
CHLORAPREP W/TINT 26 (MISCELLANEOUS) ×2 IMPLANT
CLIP APPLIE ROT 10 11.4 M/L (STAPLE) ×1 IMPLANT
CLOTH BEACON ORANGE TIMEOUT ST (SAFETY) ×2 IMPLANT
COVER LIGHT HANDLE STERIS (MISCELLANEOUS) ×4 IMPLANT
COVER WAND RF STERILE (DRAPES) ×2 IMPLANT
CUTTER FLEX LINEAR 45M (STAPLE) ×2 IMPLANT
DECANTER SPIKE VIAL GLASS SM (MISCELLANEOUS) ×2 IMPLANT
DERMABOND ADVANCED (GAUZE/BANDAGES/DRESSINGS) ×1
DERMABOND ADVANCED .7 DNX12 (GAUZE/BANDAGES/DRESSINGS) ×1 IMPLANT
ELECT REM PT RETURN 9FT ADLT (ELECTROSURGICAL) ×2
ELECTRODE REM PT RTRN 9FT ADLT (ELECTROSURGICAL) ×1 IMPLANT
GLOVE BIO SURGEON STRL SZ 6.5 (GLOVE) IMPLANT
GLOVE BIOGEL PI IND STRL 6.5 (GLOVE) ×2 IMPLANT
GLOVE BIOGEL PI IND STRL 7.0 (GLOVE) ×4 IMPLANT
GLOVE BIOGEL PI INDICATOR 6.5 (GLOVE) ×2
GLOVE BIOGEL PI INDICATOR 7.0 (GLOVE) ×4
GLOVE SURG SS PI 6.5 STRL IVOR (GLOVE) ×4 IMPLANT
GLOVE SURG SS PI 7.0 STRL IVOR (GLOVE) ×2 IMPLANT
GOWN STRL REUS W/TWL LRG LVL3 (GOWN DISPOSABLE) ×6 IMPLANT
HEMOSTAT SNOW SURGICEL 2X4 (HEMOSTASIS) ×2 IMPLANT
INST SET LAPROSCOPIC AP (KITS) ×2 IMPLANT
KIT TURNOVER KIT A (KITS) ×2 IMPLANT
MANIFOLD NEPTUNE II (INSTRUMENTS) ×2 IMPLANT
NEEDLE INSUFFLATION 14GA 120MM (NEEDLE) ×2 IMPLANT
NS IRRIG 1000ML POUR BTL (IV SOLUTION) ×2 IMPLANT
PACK LAP CHOLE LZT030E (CUSTOM PROCEDURE TRAY) ×2 IMPLANT
PAD ARMBOARD 7.5X6 YLW CONV (MISCELLANEOUS) ×2 IMPLANT
RELOAD 45 VASCULAR/THIN (ENDOMECHANICALS) ×2 IMPLANT
SET BASIN LINEN APH (SET/KITS/TRAYS/PACK) ×2 IMPLANT
SET TUBE SMOKE EVAC HIGH FLOW (TUBING) ×2 IMPLANT
SLEEVE ENDOPATH XCEL 5M (ENDOMECHANICALS) ×2 IMPLANT
SUT MNCRL AB 4-0 PS2 18 (SUTURE) ×2 IMPLANT
SUT VICRYL 0 UR6 27IN ABS (SUTURE) ×2 IMPLANT
SYS BAG RETRIEVAL 10MM (BASKET) ×1
SYSTEM BAG RETRIEVAL 10MM (BASKET) ×1 IMPLANT
TROCAR ENDO BLADELESS 11MM (ENDOMECHANICALS) ×2 IMPLANT
TROCAR XCEL NON-BLD 5MMX100MML (ENDOMECHANICALS) ×2 IMPLANT
TROCAR XCEL UNIV SLVE 11M 100M (ENDOMECHANICALS) ×2 IMPLANT
TUBE CONNECTING 12X1/4 (SUCTIONS) ×2 IMPLANT
WARMER LAPAROSCOPE (MISCELLANEOUS) ×2 IMPLANT

## 2019-10-12 NOTE — Anesthesia Postprocedure Evaluation (Signed)
Anesthesia Post Note  Patient: Barbara Jefferson  Procedure(s) Performed: LAPAROSCOPIC CHOLECYSTECTOMY (N/A )  Patient location during evaluation: PACU Anesthesia Type: General Level of consciousness: awake, oriented and patient cooperative Pain management: pain level controlled Vital Signs Assessment: post-procedure vital signs reviewed and stable Respiratory status: spontaneous breathing Cardiovascular status: stable Postop Assessment: no apparent nausea or vomiting Anesthetic complications: no     Last Vitals:  Vitals:   10/12/19 1125 10/12/19 1130  BP: 131/80 133/68  Pulse: 85 79  Resp: 14 18  Temp: 36.9 C   SpO2: 93% 93%    Last Pain:  Vitals:   10/12/19 1125  TempSrc:   PainSc: Asleep                 [name]

## 2019-10-12 NOTE — Anesthesia Procedure Notes (Signed)
Procedure Name: Intubation Date/Time: 10/12/2019 10:13 AM Performed by: Andree Elk, Rayman Petrosian A, CRNA Pre-anesthesia Checklist: Patient identified, Patient being monitored, Timeout performed, Emergency Drugs available and Suction available Patient Re-evaluated:Patient Re-evaluated prior to induction Oxygen Delivery Method: Circle system utilized Preoxygenation: Pre-oxygenation with 100% oxygen Induction Type: IV induction Laryngoscope Size: 3 and Glidescope Grade View: Grade II Tube type: Oral Tube size: 7.0 mm Number of attempts: 1 Airway Equipment and Method: Stylet and Video-laryngoscopy Placement Confirmation: ETT inserted through vocal cords under direct vision,  positive ETCO2 and breath sounds checked- equal and bilateral Secured at: 21 cm Tube secured with: Tape Dental Injury: Teeth and Oropharynx as per pre-operative assessment

## 2019-10-12 NOTE — Progress Notes (Signed)
[facility]  Notified husband that surgery complete. The gallbladder looked like empyema. No antibiotics now but more at risk for infection post op.  Rx sent in and post op phone call 10/25/19.  Curlene Labrum, MD [facility] [address] [place], [place] [address] [phone] (office)

## 2019-10-12 NOTE — Anesthesia Preprocedure Evaluation (Addendum)
Anesthesia Evaluation  Patient identified by MRN, date of birth, ID band Patient awake    Reviewed: Allergy & Precautions, NPO status , Patient's Chart, lab work & pertinent test results, reviewed documented beta blocker date and time   Airway Mallampati: III  TM Distance: >3 FB Neck ROM: Full    Dental no notable dental hx. (+) Dental Advisory Given   Pulmonary sleep apnea and Continuous Positive Airway Pressure Ventilation , Current Smoker,    Pulmonary exam normal breath sounds clear to auscultation       Cardiovascular Exercise Tolerance: Good hypertension, Pt. on medications and Pt. on home beta blockers Normal cardiovascular exam Rhythm:Regular Rate:Normal     Neuro/Psych  Headaches, Anxiety    GI/Hepatic GERD  Medicated and Controlled,  Endo/Other  diabetes (Not on meds), Type 2Morbid obesity (BMI - 39)  Renal/GU      Musculoskeletal   Abdominal   Peds  Hematology   Anesthesia Other Findings   Reproductive/Obstetrics                           Anesthesia Physical Anesthesia Plan  ASA: II  Anesthesia Plan: General   Post-op Pain Management:    Induction:   PONV Risk Score and Plan: 4 or greater and Ondansetron, Dexamethasone, Midazolam and Scopolamine patch - Pre-op  Airway Management Planned: Oral ETT  Additional Equipment:   Intra-op Plan:   Post-operative Plan: Extubation in OR  Informed Consent: I have reviewed the patients History and Physical, chart, labs and discussed the procedure including the risks, benefits and alternatives for the proposed anesthesia with the patient or authorized representative who has indicated his/her understanding and acceptance.     Dental advisory given  Plan Discussed with:   Anesthesia Plan Comments:        Anesthesia Quick Evaluation

## 2019-10-12 NOTE — Discharge Instructions (Signed)
Discharge Laparoscopic Surgery Instructions:  Common Complaints: Right shoulder pain is common after laparoscopic surgery. This is secondary to the gas used in the surgery being trapped under the diaphragm.  Walk to help your body absorb the gas. This will improve in a few days. Pain at the port sites are common, especially the larger port sites. This will improve with time.  Some nausea is common and poor appetite. The main goal is to stay hydrated the first few days after surgery.   Diet/ Activity: Diet as tolerated. You may not have an appetite, but it is important to stay hydrated. Drink 64 ounces of water a day. Your appetite will return with time.  Shower per your regular routine daily.  Do not take hot showers. Take warm showers that are less than 10 minutes. Rest and listen to your body, but do not remain in bed all day.  Walk everyday for at least 15-20 minutes. Deep cough and move around every 1-2 hours in the first few days after surgery.  Do not lift > 10 lbs, perform excessive bending, pushing, pulling, squatting for 1-2 weeks after surgery.  Do not pick at the dermabond glue on your incision sites.  This glue film will remain in place for 1-2 weeks and will start to peel off.  Do not place lotions or balms on your incision unless instructed to specifically by Dr. Constance Haw.   Medication: Take tylenol and ibuprofen as needed for pain control, alternating every 4-6 hours.  Example:  Tylenol 1000mg  @ 6am, 12noon, 6pm, 4midnight (Do not exceed 4000mg  of tylenol a day). Ibuprofen 800mg  @ 9am, 3pm, 9pm, 3am (Do not exceed 3600mg  of ibuprofen a day).  Take Roxicodone for breakthrough pain every 4 hours.  Take Colace for constipation related to narcotic pain medication. If you do not have a bowel movement in 2 days, take Miralax over the counter.  Drink plenty of water to also prevent constipation.   Contact Information: If you have questions or concerns, please call our office,  [phone], Monday- Thursday 8AM-5PM and Friday 8AM-12Noon.  If it is after hours or on the weekend, please call [facility], [phone], and ask to speak to the surgeon on call for Dr. Constance Haw at [facility].    Laparoscopic Cholecystectomy, Care After This sheet gives you information about how to care for yourself after your procedure. Your health care provider may also give you more specific instructions. If you have problems or questions, contact your health care provider. What can I expect after the procedure? After the procedure, it is common to have:  Pain at your incision sites. You will be given medicines to control this pain.  Mild nausea or vomiting.  Bloating and possible shoulder pain from the air-like gas that was used during the procedure. Follow these instructions at home: Incision care   Follow instructions from your health care provider about how to take care of your incisions. Make sure you: ? Wash your hands with soap and water before you change your bandage (dressing). If soap and water are not available, use hand sanitizer. ? Change your dressing as told by your health care provider. ? Leave stitches (sutures), skin glue, or adhesive strips in place. These skin closures may need to be in place for 2 weeks or longer. If adhesive strip edges start to loosen and curl up, you may trim the loose edges. Do not remove adhesive strips completely unless your health care provider tells you to do that.  Do not take baths, swim, or use a hot tub until your health care provider approves.  You may shower.  Check your incision area every day for signs of infection. Check for: ? More redness, swelling, or pain. ? More fluid or blood. ? Warmth. ? Pus or a bad smell. Activity  Do not drive or use heavy machinery while taking prescription pain medicine.  Do not lift anything that is heavier than 10 lb (4.5 kg) until your health care provider approves.  Do not play  contact sports until your health care provider approves.  Do not drive for 24 hours if you were given a medicine to help you relax (sedative).  Rest as needed. Do not return to work or school until your health care provider approves. General instructions  Take over-the-counter and prescription medicines only as told by your health care provider.  To prevent or treat constipation while you are taking prescription pain medicine, your health care provider may recommend that you: ? Drink enough fluid to keep your urine clear or pale yellow. ? Take over-the-counter or prescription medicines. ? Eat foods that are high in fiber, such as fresh fruits and vegetables, whole grains, and beans. ? Limit foods that are high in fat and processed sugars, such as fried and sweet foods. Contact a health care provider if:  You develop a rash.  You have more redness, swelling, or pain around your incisions.  You have more fluid or blood coming from your incisions.  Your incisions feel warm to the touch.  You have pus or a bad smell coming from your incisions.  You have a fever.  One or more of your incisions breaks open. Get help right away if:  You have trouble breathing.  You have chest pain.  You have increasing pain in your shoulders.  You faint or feel dizzy when you stand.  You have severe pain in your abdomen.  You have nausea or vomiting that lasts for more than one day.  You have leg pain. This information is not intended to replace advice given to you by your health care provider. Make sure you discuss any questions you have with your health care provider. Document Released: 10/20/2005 Document Revised: 10/02/2017 Document Reviewed: 04/07/2016 Elsevier Patient Education  Short.   Hyperglycemia Hyperglycemia occurs when the level of sugar (glucose) in the blood is too high. Glucose is a type of sugar that provides the body's main source of energy. Certain hormones  (insulin and glucagon) control the level of glucose in the blood. Insulin lowers blood glucose, and glucagon increases blood glucose. Hyperglycemia can result from having too little insulin in the bloodstream, or from the body not responding normally to insulin. Hyperglycemia occurs most often in people who have diabetes (diabetes mellitus), but it can happen in people who do not have diabetes. It can develop quickly, and it can be life-threatening if it causes you to become severely dehydrated (diabetic ketoacidosis or hyperglycemic hyperosmolar state). Severe hyperglycemia is a medical emergency. What are the causes? If you have diabetes, hyperglycemia may be caused by:  Diabetes medicine.  Medicines that increase blood glucose or affect your diabetes control.  Not eating enough, or not eating often enough.  Changes in physical activity level.  Being sick or having an infection. If you have prediabetes or undiagnosed diabetes:  Hyperglycemia may be caused by those conditions. If you do not have diabetes, hyperglycemia may be caused by:  Certain medicines, including steroid medicines, beta-blockers,  epinephrine, and thiazide diuretics.  Stress.  Serious illness.  Surgery.  Diseases of the pancreas.  Infection. What increases the risk? Hyperglycemia is more likely to develop in people who have risk factors for diabetes, such as:  Having a family member with diabetes.  Having a gene for type 1 diabetes that is passed from parent to child (inherited).  Living in an area with cold weather conditions.  Exposure to certain viruses.  Certain conditions in which the body's disease-fighting (immune) system attacks itself (autoimmune disorders).  Being overweight or obese.  Having an inactive (sedentary) lifestyle.  Having been diagnosed with insulin resistance.  Having a history of prediabetes, gestational diabetes, or polycystic ovarian syndrome (PCOS).  Being of  American-Indian, African-American, Hispanic/Latino, or Asian/Pacific Islander descent. What are the signs or symptoms? Hyperglycemia may not cause any symptoms. If you do have symptoms, they may include early warning signs, such as:  Increased thirst.  Hunger.  Feeling very tired.  Needing to urinate more often than usual.  Blurry vision. Other symptoms may develop if hyperglycemia gets worse, such as:  Dry mouth.  Loss of appetite.  Fruity-smelling breath.  Weakness.  Unexpected or rapid weight gain or weight loss.  Tingling or numbness in the hands or feet.  Headache.  Skin that does not quickly return to normal after being lightly pinched and released (poor skin turgor).  Abdominal pain.  Cuts or bruises that are slow to heal. How is this diagnosed? Hyperglycemia is diagnosed with a blood test to measure your blood glucose level. This blood test is usually done while you are having symptoms. Your health care provider may also do a physical exam and review your medical history. You may have more tests to determine the cause of your hyperglycemia, such as:  A fasting blood glucose (FBG) test. You will not be allowed to eat (you will fast) for at least 8 hours before a blood sample is taken.  An A1c (hemoglobin A1c) blood test. This provides information about blood glucose control over the previous 2-3 months.  An oral glucose tolerance test (OGTT). This measures your blood glucose at two times: ? After fasting. This is your baseline blood glucose level. ? Two hours after drinking a beverage that contains glucose. How is this treated? Treatment depends on the cause of your hyperglycemia. Treatment may include:  Taking medicine to regulate your blood glucose levels. If you take insulin or other diabetes medicines, your medicine or dosage may be adjusted.  Lifestyle changes, such as exercising more, eating healthier foods, or losing weight.  Treating an illness or  infection, if this caused your hyperglycemia.  Checking your blood glucose more often.  Stopping or reducing steroid medicines, if these caused your hyperglycemia. If your hyperglycemia becomes severe and it results in hyperglycemic hyperosmolar state, you must be hospitalized and given IV fluids. Follow these instructions at home:  General instructions  Take over-the-counter and prescription medicines only as told by your health care provider.  Do not use any products that contain nicotine or tobacco, such as cigarettes and e-cigarettes. If you need help quitting, ask your health care provider.  Limit alcohol intake to no more than 1 drink per day for nonpregnant women and 2 drinks per day for men. One drink equals 12 oz of beer, 5 oz of wine, or 1 oz of hard liquor.  Learn to manage stress. If you need help with this, ask your health care provider.  Keep all follow-up visits as told by  your health care provider. This is important. Eating and drinking   Maintain a healthy weight.  Exercise regularly, as directed by your health care provider.  Stay hydrated, especially when you exercise, get sick, or spend time in hot temperatures.  Eat healthy foods, such as: ? Lean proteins. ? Complex carbohydrates. ? Fresh fruits and vegetables. ? Low-fat dairy products. ? Healthy fats.  Drink enough fluid to keep your urine clear or pale yellow. If you have diabetes:  Make sure you know the symptoms of hyperglycemia.  Follow your diabetes management plan, as told by your health care provider. Make sure you: ? Take your insulin and medicines as directed. ? Follow your exercise plan. ? Follow your meal plan. Eat on time, and do not skip meals. ? Check your blood glucose as often as directed. Make sure to check your blood glucose before and after exercise. If you exercise longer or in a different way than usual, check your blood glucose more often. ? Follow your sick day plan whenever  you cannot eat or drink normally. Make this plan in advance with your health care provider.  Share your diabetes management plan with people in your workplace, school, and household.  Check your urine for ketones when you are ill and as told by your health care provider.  Carry a medical alert card or wear medical alert jewelry. Contact a health care provider if:  Your blood glucose is at or above 240 mg/dL (13.3 mmol/L) for 2 days in a row.  You have problems keeping your blood glucose in your target range.  You have frequent episodes of hyperglycemia. Get help right away if:  You have difficulty breathing.  You have a change in how you think, feel, or act (mental status).  You have nausea or vomiting that does not go away. These symptoms may represent a serious problem that is an emergency. Do not wait to see if the symptoms will go away. Get medical help right away. Call your local emergency services (911 in the [place] Do not drive yourself to the hospital. Summary  Hyperglycemia occurs when the level of sugar (glucose) in the blood is too high.  Hyperglycemia is diagnosed with a blood test to measure your blood glucose level. This blood test is usually done while you are having symptoms. Your health care provider may also do a physical exam and review your medical history.  If you have diabetes, follow your diabetes management plan as told by your health care provider.  Contact your health care provider if you have problems keeping your blood glucose in your target range. This information is not intended to replace advice given to you by your health care provider. Make sure you discuss any questions you have with your health care provider. Document Released: 04/15/2001 Document Revised: 07/07/2016 Document Reviewed: 07/07/2016 Elsevier Patient Education  2020 [place] Anesthesia, Adult, Care After This sheet gives you information about how to care for yourself  after your procedure. Your health care provider may also give you more specific instructions. If you have problems or questions, contact your health care provider. What can I expect after the procedure? After the procedure, the following side effects are common:  Pain or discomfort at the IV site.  Nausea.  Vomiting.  Sore throat.  Trouble concentrating.  Feeling cold or chills.  Weak or tired.  Sleepiness and fatigue.  Soreness and body aches. These side effects can affect parts of the body that were  not involved in surgery. Follow these instructions at home:  For at least 24 hours after the procedure:  Have a responsible adult stay with you. It is important to have someone help care for you until you are awake and alert.  Rest as needed.  Do not: ? Participate in activities in which you could fall or become injured. ? Drive. ? Use heavy machinery. ? Drink alcohol. ? Take sleeping pills or medicines that cause drowsiness. ? Make important decisions or sign legal documents. ? Take care of children on your own. Eating and drinking  Follow any instructions from your health care provider about eating or drinking restrictions.  When you feel hungry, start by eating small amounts of foods that are soft and easy to digest (bland), such as toast. Gradually return to your regular diet.  Drink enough fluid to keep your urine pale yellow.  If you vomit, rehydrate by drinking water, juice, or clear broth. General instructions  If you have sleep apnea, surgery and certain medicines can increase your risk for breathing problems. Follow instructions from your health care provider about wearing your sleep device: ? Anytime you are sleeping, including during daytime naps. ? While taking prescription pain medicines, sleeping medicines, or medicines that make you drowsy.  Return to your normal activities as told by your health care provider. Ask your health care provider what  activities are safe for you.  Take over-the-counter and prescription medicines only as told by your health care provider.  If you smoke, do not smoke without supervision.  Keep all follow-up visits as told by your health care provider. This is important. Contact a health care provider if:  You have nausea or vomiting that does not get better with medicine.  You cannot eat or drink without vomiting.  You have pain that does not get better with medicine.  You are unable to pass urine.  You develop a skin rash.  You have a fever.  You have redness around your IV site that gets worse. Get help right away if:  You have difficulty breathing.  You have chest pain.  You have blood in your urine or stool, or you vomit blood. Summary  After the procedure, it is common to have a sore throat or nausea. It is also common to feel tired.  Have a responsible adult stay with you for the first 24 hours after general anesthesia. It is important to have someone help care for you until you are awake and alert.  When you feel hungry, start by eating small amounts of foods that are soft and easy to digest (bland), such as toast. Gradually return to your regular diet.  Drink enough fluid to keep your urine pale yellow.  Return to your normal activities as told by your health care provider. Ask your health care provider what activities are safe for you. This information is not intended to replace advice given to you by your health care provider. Make sure you discuss any questions you have with your health care provider. Document Released: 01/26/2001 Document Revised: 10/23/2017 Document Reviewed: 06/05/2017 Elsevier Patient Education  2020 [employer].

## 2019-10-12 NOTE — Op Note (Signed)
Operative Note   Preoperative Diagnosis: Symptomatic cholelithiasis   Postoperative Diagnosis: Empyema of gallbladder    Procedure(s) Performed: Laparoscopic cholecystectomy   Surgeon: Ria Comment C. Constance Haw, MD   Assistants: Aviva Signs, MD    Anesthesia: General endotracheal   Anesthesiologist: Dr. Wyatt Haste    Specimens: Gallbladder    Estimated Blood Loss: Minimal    Blood Replacement: None    Complications: None    Operative Findings: Distended gallbladder with cloudy, purulent looking bile    Procedure: The patient was taken to the operating room and placed supine. General endotracheal anesthesia was induced. Intravenous antibiotics were administered per protocol. An orogastric tube positioned to decompress the stomach. The abdomen was prepared and draped in the usual sterile fashion.    A supraumbilical incision was made and a Veress technique was utilized to achieve pneumoperitoneum to 15 mmHg with carbon dioxide. A 11 mm optiview port was placed through the supraumbilical region, and a 10 mm 0-degree operative laparoscope was introduced. The area underlying the trocar and Veress needle were inspected and without evidence of injury.  Remaining trocars were placed under direct vision. Two 5 mm ports were placed in the right abdomen, between the anterior axillary and midclavicular line.  A final 11 mm port was placed through the mid-epigastrium, near the falciform ligament.    The gallbladder was distended and thickened.  Her liver was very large and had a fatty appearance.  In order to better elevate the gallbladder dissection was first performed on the medial and lateral sides.  The gallbladder fundus was elevated cephalad and the infundibulum was retracted to the patient's right. The gallbladder/cystic duct junction was skeletonized. From the infundibulum down to the cystic duct there was a gradual taper where the cystic duct was very large.  I was able to dissect this out, but  given the size, I wanted to ensure that we were sure about the structure. I then finished the lateral and medial mobilization and doom down. The gallbladder was entered and purulent bile was noted, confirming a empyema of the gallbladder.  The gallbladder was very friable and pulled of the hepatic bed with little tension.  Carefully this was dissected down. On the lateral and posterior aspect of the gallbladder and mesentery a vessel ran up the gallbladder, and this was clipped to control bleeding.  This was small and going into the gallbladder.  After the dome down, it was clear we were on a pedicle, and either the small artery posterior was the cystic artery or there was a second structure running with the enlarged cystic duct.  I could not clear the duct any further due to the inflammation, and opted to come across at the cystic duct/ infundibulum junction with a vascular load EndoGIA stapler.  Again, we were on a pedicle, and no structures were headed back to the gallbladder.   The specimen was placed in an Endopouch and was retrieved through the epigastric site.   Final inspection revealed acceptable hemostasis. Surgical Emogene Morgan was placed in the gallbladder bed.  Trocars were removed and pneumoperitoneum was released.  The epigastric and umbilical port sites were very small and overlying the rib. Skin incisions were closed with 4-0 Monocryl subcuticular sutures and Dermabond. The patient was awakened from anesthesia and extubated without complication.    Curlene Labrum, MD [facility] [address] [place], [place] [address] [phone] (office)

## 2019-10-12 NOTE — Transfer of Care (Signed)
Immediate Anesthesia Transfer of Care Note  Patient: Mabrie Serres  Procedure(s) Performed: LAPAROSCOPIC CHOLECYSTECTOMY (N/A )  Patient Location: PACU  Anesthesia Type:General  Level of Consciousness: oriented, drowsy and patient cooperative  Airway & Oxygen Therapy: Patient Spontanous Breathing  Post-op Assessment: Report given to RN and Post -op Vital signs reviewed and stable  Post vital signs: Reviewed and stable  Last Vitals:  Vitals Value Taken Time  BP 131/80 10/12/19 1122  Temp    Pulse 86 10/12/19 1124  Resp 11 10/12/19 1124  SpO2 89 % 10/12/19 1124  Vitals shown include unvalidated device data.  Last Pain:  Vitals:   10/12/19 0925  TempSrc: Oral  PainSc: 0-No pain      Patients Stated Pain Goal: 8 ([id number])  Complications: No apparent anesthesia complications

## 2019-10-12 NOTE — Interval H&P Note (Signed)
History and Physical Interval Note:  10/12/2019 9:27 AM  Barbara Jefferson  has presented today for surgery, with the diagnosis of GALLSTONES.  The various methods of treatment have been discussed with the patient and family. After consideration of risks, benefits and other options for treatment, the patient has consented to  Procedure(s): LAPAROSCOPIC CHOLECYSTECTOMY (N/A) as a surgical intervention.  The patient's history has been reviewed, patient examined, no change in status, stable for surgery.  I have reviewed the patient's chart and labs.  Questions were answered to the patient's satisfaction.    No changes. Notified BS > 200, and needs to see PCP regarding prediabetes. Told her they will likely recommend weight loss.   Virl Cagey

## 2019-10-13 LAB — SURGICAL PATHOLOGY

## 2019-10-17 ENCOUNTER — Telehealth: Payer: Self-pay

## 2019-10-17 NOTE — Telephone Encounter (Addendum)
Patient called and left message on 10/14/2019 stating that she was running a low grade fever and was concerned. MD notified.  Called patient back, lvm, told patient to make sure she is taking deep breathes and coughing, may take tylenol for low grade fever. If fever is above 101F to please go to ED.

## 2019-10-25 ENCOUNTER — Other Ambulatory Visit: Payer: Self-pay

## 2019-10-25 ENCOUNTER — Telehealth (INDEPENDENT_AMBULATORY_CARE_PROVIDER_SITE_OTHER): Payer: Self-pay | Admitting: General Surgery

## 2019-10-25 DIAGNOSIS — K8010 Calculus of gallbladder with chronic cholecystitis without obstruction: Secondary | ICD-10-CM

## 2019-10-25 NOTE — Progress Notes (Signed)
[facility]  I am calling the patient for post operative evaluation due to the current COVID 19 pandemic.  The patient had a laparoscopic cholecystectomy on 12/09. The patient reports that they are doing good. The are tolerating a diet, having good pain control, and having regular Bms.  The patient has no concerns.  Dermabond has been peeling and had some drainage (maybe a few dots) not redness.   Will see the patient PRN.   Pathology: FINAL MICROSCOPIC DIAGNOSIS:   A. GALLBLADDER, CHOLECYSTECTOMY:  - Chronic cholecystitis with cholelithiasis   Curlene Labrum, MD [facility] [address] [place], [place] [address] [phone] (office)

## 2020-01-31 ENCOUNTER — Ambulatory Visit: Payer: Managed Care, Other (non HMO) | Admitting: Physician Assistant

## 2020-02-24 ENCOUNTER — Telehealth: Payer: Self-pay | Admitting: Physician Assistant

## 2020-02-24 MED ORDER — DOXYCYCLINE HYCLATE 100 MG PO TABS: 100.0000 mg | ORAL_TABLET | Freq: Two times a day (BID) | ORAL | 0 refills | Status: AC

## 2020-02-24 NOTE — Telephone Encounter (Signed)
Phone call to patient to see where she's currently having the Hidradenitis Suppurativa (HS) flare at?  Patient states that she's currently having the flare around her C-section cut and she's wanting to know if Robyne Askew will prescribe her something to use over the weekend?  Patient states she has an appointment with Robyne Askew on 02/28/2020.  Per Robyne Askew okay to send in Doxycycline 100mg  BID #20 [id number].  Patient aware of Kelli's recommendations.  Rx sent into Pharmacy.

## 2020-02-24 NOTE — Telephone Encounter (Addendum)
Patient is calling to see if she can get a prescription called for her Hidradenitis Suppurativa (HS).  HS is flaring.  I put her in for an appointment for next Tuesday, 02/28/2020.  [facility] uses [facility] in [place], [place].  Sohini says she usually comes in for injections, so she has never had a prescription for this before.  Chart # [id number]

## 2020-02-28 ENCOUNTER — Ambulatory Visit: Payer: Managed Care, Other (non HMO) | Admitting: Physician Assistant

## 2020-03-01 ENCOUNTER — Telehealth: Payer: Self-pay | Admitting: Physician Assistant

## 2020-03-01 NOTE — Telephone Encounter (Addendum)
Patient calling to report that antibiotic is not working and would like to discuss treatment plan. Patient must pay past due balance of $244.35 to make an appointment. (Patient card declined) Chart [phone]

## 2020-03-01 NOTE — Telephone Encounter (Signed)
Called pt to let her know that because she is in collections we are unable to see her in the office or give any medication until balance is paid.  Pt states she paid  Balance this morning but according to stephanie pt card was declined.  I informed pt that the payment/card was declined and she states stephanie put in card number wrong.  I also informed pt per [facility] that I looked back at office visit notes and patient cancelled office visit on 3/30 and didn't show to April 27 appointment.  Patient then states she didn't come to April 27 appointment because we "wouldn't see her"  Informed pt again that because she had a balance we are unable ' to see her until balance is paid.  Also per [facility] informed patient that when she doesn't call to cancel appointment and doesn't show up she is taking away appointment from somebody.  Patient understood. Patient said so what am I suppose to do because the antibiotic isn't working.  I informed patient that we can't do anything.. that her best option would be urgent care or primary care.  Patient understood.

## 2020-04-03 DIAGNOSIS — E119 Type 2 diabetes mellitus without complications: Secondary | ICD-10-CM

## 2020-04-03 HISTORY — DX: Type 2 diabetes mellitus without complications: E11.9

## 2020-04-04 ENCOUNTER — Telehealth: Payer: Self-pay | Admitting: Physician Assistant

## 2020-04-04 NOTE — Telephone Encounter (Signed)
Patient has been taking ATB's and cimzia shots with no improvements of HS. She is having breakout under her arms and in groin area. She wants to know if she should be seen of if she can try something else.

## 2020-04-04 NOTE — Telephone Encounter (Signed)
Phone call to patient to inform her that she would have to be seen and I could schedule her with one of the other Providers in the office since Azalee Course is out of the office for two week.  I also informed patient that per our office manager Torrie Mayers she would have to pay her past due balance of $341.90 from the [facility] system (pre epic) either during her appointment or at the time of her appointment.  Patient aware, patient states that she's not going to schedule an appointment because she can't pay that amount and she'll just have to find another Provider to see.

## 2020-05-24 ENCOUNTER — Telehealth: Payer: Self-pay | Admitting: *Deleted

## 2020-05-24 ENCOUNTER — Ambulatory Visit (INDEPENDENT_AMBULATORY_CARE_PROVIDER_SITE_OTHER): Payer: Managed Care, Other (non HMO) | Admitting: Physician Assistant

## 2020-05-24 ENCOUNTER — Other Ambulatory Visit: Payer: Self-pay

## 2020-05-24 ENCOUNTER — Encounter: Payer: Self-pay | Admitting: Physician Assistant

## 2020-05-24 DIAGNOSIS — L732 Hidradenitis suppurativa: Secondary | ICD-10-CM

## 2020-05-24 DIAGNOSIS — L089 Local infection of the skin and subcutaneous tissue, unspecified: Secondary | ICD-10-CM | POA: Diagnosis not present

## 2020-05-24 DIAGNOSIS — L409 Psoriasis, unspecified: Secondary | ICD-10-CM

## 2020-05-24 MED ORDER — CLINDAMYCIN HCL 300 MG PO CAPS: 300.0000 mg | ORAL_CAPSULE | Freq: Two times a day (BID) | ORAL | 2 refills | Status: DC

## 2020-05-24 MED ORDER — RIFAMPIN 300 MG PO CAPS: 300.0000 mg | ORAL_CAPSULE | Freq: Two times a day (BID) | ORAL | 2 refills | Status: DC

## 2020-05-24 MED ORDER — SPIRONOLACTONE 25 MG PO TABS: 25.0000 mg | ORAL_TABLET | Freq: Every day | ORAL | 0 refills | Status: DC

## 2020-05-24 MED ORDER — TRIAMCINOLONE ACETONIDE 10 MG/ML IJ SUSP
10.0000 mg | Freq: Once | INTRAMUSCULAR | Status: AC
  Administered 2020-05-24: 10 mg

## 2020-05-24 NOTE — Progress Notes (Deleted)
   Follow-Up Visit   Subjective  Barbara Jefferson is a 39 y.o. female who presents for the following: Follow-up (H.S.- using cimzia- not any better - active bumps groin and armpits). She is in a lot of pain, not on antibiotics and has had gallbladder and back surgery recently. She thinks these sent her into a flare. There was some question about possibly being allergic to Humira in the past( this was improving her situation) but patient thinks it was amoxicillin. She wants to go back on it despite the risks.  Her psoriasis is flaring also. Patient has lost 10 pounds.  Still smoking.   The following portions of the chart were reviewed this encounter and updated as appropriate: Tobacco  Allergies  Meds  Problems  Med Hx  Surg Hx  Fam Hx      Objective  Well appearing patient in no apparent distress; mood and affect are within normal limits.  A full examination was performed including scalp, head, eyes, ears, nose, lips, neck, chest, axillae, abdomen, back, buttocks, bilateral upper extremities, bilateral lower extremities, hands, feet, fingers, toes, fingernails, and toenails. All findings within normal limits unless otherwise noted below.  Objective  Left Axilla, Left Inguinal Area, Right Axilla, Right Suprapubic Area:  Edematous area with yellow exudate draining and red, inflamed cysts.  Objective  Head - Anterior (Face), Left Abdomen (side) - Upper, Left Hand - Anterior, Left Lateral Plantar Surface, Left Medial Plantar Surface, Left Mid Palm, Left Posterior Heel Region, Left Thigh - Anterior, Right Hand - Anterior, Right Inframammary Fold, Right Medial Plantar Surface, Right Mid Palm, Right Plantar Surface of Heel, Right Posterior Heel Region: Erythema with significant scale. Feet > hands.  Assessment & Plan  Infection of skin and subcutaneous tissue  Other Related Procedures Anaerobic and Aerobic Culture  Hidradenitis suppurativa (4) Left Axilla; Right Axilla; Left Inguinal Area;  Right Suprapubic Area  rifampin (RIFADIN) 300 MG capsule - Left Axilla, Left Inguinal Area, Right Axilla, Right Suprapubic Area  clindamycin (CLEOCIN) 300 MG capsule - Left Axilla, Left Inguinal Area, Right Axilla, Right Suprapubic Area  spironolactone (ALDACTONE) 25 MG tablet - Left Axilla, Left Inguinal Area, Right Axilla, Right Suprapubic Area  Ambulatory referral to Dermatology - Left Axilla, Left Inguinal Area, Right Axilla, Right Suprapubic Area  Psoriasis (14) Head - Anterior (Face); Left Hand - Anterior; Right Hand - Anterior; Left Thigh - Anterior; Left Abdomen (side) - Upper; Right Inframammary Fold; Left Mid Palm; Right Mid Palm; Left Lateral Plantar Surface; Left Medial Plantar Surface; Right Medial Plantar Surface; Right Plantar Surface of Heel; Left Posterior Heel Region; Right Posterior Heel Region  Continue cimzia. obtain approval for Humira. Continue Enstillar foam    I, Taylinn Brabant, PA-C, have reviewed all documentation's for this visit.  The documentation on 05/24/20 for the exam, diagnosis, procedures and orders are all accurate and complete.

## 2020-05-24 NOTE — Telephone Encounter (Signed)
Referral sent to Dr. Edison Simon in [place] per [facility].

## 2020-05-24 NOTE — Telephone Encounter (Signed)
D/c Cimzia per [facility] [place]: restart Humira: sent prescription via [facility]; fpr starter kit and maintaince dose.

## 2020-05-29 ENCOUNTER — Other Ambulatory Visit: Payer: Self-pay

## 2020-05-29 LAB — ANAEROBIC AND AEROBIC CULTURE
MICRO NUMBER:: 10737322
MICRO NUMBER:: 10737323
SPECIMEN QUALITY:: ADEQUATE
SPECIMEN QUALITY:: ADEQUATE

## 2020-05-29 MED ORDER — HUMIRA (2 PEN) 40 MG/0.4ML ~~LOC~~ AJKT: 40.0000 mg | AUTO-INJECTOR | SUBCUTANEOUS | 3 refills | Status: DC

## 2020-05-29 MED ORDER — HUMIRA-PSORIASIS/UVEIT STARTER 80 MG/0.8ML & 40MG/0.4ML ~~LOC~~ PNKT: 80.0000 mg | PEN_INJECTOR | SUBCUTANEOUS | 0 refills | Status: DC

## 2020-05-29 NOTE — Telephone Encounter (Signed)
Patient insurance stated she must use [facility]. Fax [phone] and phone [phone]

## 2020-06-01 ENCOUNTER — Other Ambulatory Visit: Payer: Self-pay

## 2020-06-01 ENCOUNTER — Ambulatory Visit: Payer: Managed Care, Other (non HMO) | Admitting: Physician Assistant

## 2020-06-01 MED ORDER — HUMIRA-PSORIASIS/UVEIT STARTER 80 MG/0.8ML & 40MG/0.4ML ~~LOC~~ PNKT: 80.0000 mg | PEN_INJECTOR | SUBCUTANEOUS | 0 refills | Status: AC

## 2020-06-01 MED ORDER — HUMIRA (2 PEN) 40 MG/0.4ML ~~LOC~~ AJKT: 40.0000 mg | AUTO-INJECTOR | SUBCUTANEOUS | 3 refills | Status: DC

## 2020-06-04 ENCOUNTER — Other Ambulatory Visit: Payer: Self-pay | Admitting: *Deleted

## 2020-06-06 ENCOUNTER — Encounter: Payer: Self-pay | Admitting: Physician Assistant

## 2020-06-07 ENCOUNTER — Encounter: Payer: Self-pay | Admitting: *Deleted

## 2020-06-12 ENCOUNTER — Other Ambulatory Visit: Payer: Self-pay | Admitting: Physician Assistant

## 2020-06-12 NOTE — Telephone Encounter (Signed)
[facility] left message on office voice mail saying that they were calling on behalf of [facility] about the prescription for Humira.  [facility] would like a return phone call at [phone].

## 2020-06-13 ENCOUNTER — Telehealth: Payer: Self-pay | Admitting: Physician Assistant

## 2020-06-13 NOTE — Telephone Encounter (Signed)
Barbara Jefferson left a message on office voice mail saying that they had spoken with nurse Lonell Grandchild here about the Humira Starter Kit, but failed to get the information on which starter kit was to be prescribed.  [facility] would like a call back at [phone].

## 2020-06-15 ENCOUNTER — Other Ambulatory Visit: Payer: Self-pay | Admitting: Physician Assistant

## 2020-06-15 ENCOUNTER — Telehealth: Payer: Self-pay | Admitting: Physician Assistant

## 2020-06-15 DIAGNOSIS — L732 Hidradenitis suppurativa: Secondary | ICD-10-CM

## 2020-06-15 NOTE — Telephone Encounter (Signed)
[facility] left message on office voice mail saying that they did receive the verbal prescription about Humira, but they still need to know which starter kit she needs.

## 2020-06-17 ENCOUNTER — Encounter: Payer: Self-pay | Admitting: Physician Assistant

## 2020-06-19 ENCOUNTER — Other Ambulatory Visit: Payer: Self-pay | Admitting: *Deleted

## 2020-06-19 MED ORDER — SPIRONOLACTONE 50 MG PO TABS: 50.0000 mg | ORAL_TABLET | Freq: Every day | ORAL | 6 refills | Status: DC

## 2020-06-20 NOTE — Telephone Encounter (Signed)
[facility] called again to confirm dose, patuent has HS HUMIRA CITRATE FREE STARTER DOSE INJECT 160 MG ON DAY 1, 80MG  ON DAY 15 MAINTENANCE DOSE 40 MG WEEKLY

## 2020-06-21 ENCOUNTER — Ambulatory Visit: Payer: Managed Care, Other (non HMO) | Admitting: Physician Assistant

## 2020-06-25 ENCOUNTER — Other Ambulatory Visit: Payer: Self-pay | Admitting: Physician Assistant

## 2020-06-25 NOTE — Telephone Encounter (Signed)
Phone call to [facility] to varify patients prescription. They wanted to make sure that patient had a negative TB test because she had recently filled rifampin prescription. Told her negative TB and rifampin was for HS flare up. They will fill Humira.

## 2020-06-25 NOTE — Telephone Encounter (Signed)
[facility]  Left message on office voice mail saying that they needed clarification of Barbara Jefferson's prescription and to use reference #[id number].  (Chart # [id number])

## 2020-06-28 ENCOUNTER — Other Ambulatory Visit: Payer: Self-pay | Admitting: Physician Assistant

## 2020-07-06 NOTE — Progress Notes (Signed)
   Follow-Up Visit   Subjective  Barbara Jefferson is a 39 y.o. female who presents for the following: Follow-up (H.S.- using cimzia- not any better - active bumps groin and armpits).   The following portions of the chart were reviewed this encounter and updated as appropriate: Tobacco  Allergies  Meds  Problems  Med Hx  Surg Hx  Fam Hx      Objective  Well appearing patient in no apparent distress; mood and affect are within normal limits.  A full examination was performed including scalp, head, eyes, ears, nose, lips, neck, chest, axillae, abdomen, back, buttocks, bilateral upper extremities, bilateral lower extremities, hands, feet, fingers, toes, fingernails, and toenails. All findings within normal limits unless otherwise noted below.  Objective  Left Axilla, Left Inguinal Area, Right Axilla, Right Suprapubic Area:  Edematous area with yellow exudate draining and red, inflamed cysts.  Objective  Head - Anterior (Face), Left Abdomen (side) - Upper, Left Hand - Anterior, Left Lateral Plantar Surface, Left Medial Plantar Surface, Left Mid Palm, Left Posterior Heel Region, Left Thigh - Anterior, Right Hand - Anterior, Right Inframammary Fold, Right Medial Plantar Surface, Right Mid Palm, Right Plantar Surface of Heel, Right Posterior Heel Region: Erythema with significant scale. Feet > hands.   Assessment & Plan  Infection of skin and subcutaneous tissue  Other Related Procedures Anaerobic and Aerobic Culture  Hidradenitis suppurativa (4) Left Axilla; Right Axilla; Left Inguinal Area; Right Suprapubic Area  rifampin (RIFADIN) 300 MG capsule - Left Axilla, Left Inguinal Area, Right Axilla, Right Suprapubic Area  clindamycin (CLEOCIN) 300 MG capsule - Left Axilla, Left Inguinal Area, Right Axilla, Right Suprapubic Area  Ambulatory referral to Dermatology - Left Axilla, Left Inguinal Area, Right Axilla, Right Suprapubic Area  Intralesional injection - Left Axilla, Left Inguinal  Area, Right Axilla, Right Suprapubic Area Location:left axillae, right axilla, left inquinal, right suprapubic  Informed Consent: Discussed risks (infection, pain, bleeding, bruising, thinning of the skin, loss of skin pigment, lack of resolution, and recurrence of lesion) and benefits of the procedure, as well as the alternatives. Informed consent was obtained. Preparation: The area was prepared a standard fashion.  Anesthesia:none  Procedure Details: An intralesional injection was performed with Kenalog 10mg /cc. 1 cc in total were injected.  Total number of injections: 4  Plan: The patient was instructed on post-op care. Recommend OTC analgesia as needed for pain.   Psoriasis (14) Head - Anterior (Face); Left Hand - Anterior; Right Hand - Anterior; Left Thigh - Anterior; Left Abdomen (side) - Upper; Right Inframammary Fold; Left Mid Palm; Right Mid Palm; Left Lateral Plantar Surface; Left Medial Plantar Surface; Right Medial Plantar Surface; Right Plantar Surface of Heel; Left Posterior Heel Region; Right Posterior Heel Region  Continue cimzia.Marland Kitchen obtain Humira to start. Continue Enstillar foam    I, Montray Kliebert, PA-C, have reviewed all documentation's for this visit.  The documentation on 07/06/20 for the exam, diagnosis, procedures and orders are all accurate and complete.

## 2020-07-10 ENCOUNTER — Other Ambulatory Visit: Payer: Self-pay | Admitting: Physician Assistant

## 2020-07-11 ENCOUNTER — Other Ambulatory Visit: Payer: Self-pay | Admitting: *Deleted

## 2020-07-11 MED ORDER — SPIRONOLACTONE 50 MG PO TABS: 50.0000 mg | ORAL_TABLET | Freq: Two times a day (BID) | ORAL | 0 refills | Status: DC

## 2020-08-13 ENCOUNTER — Telehealth: Payer: Self-pay | Admitting: *Deleted

## 2020-08-13 NOTE — Telephone Encounter (Signed)
Prior authorization done via cover my meds.   

## 2020-08-17 ENCOUNTER — Other Ambulatory Visit: Payer: Self-pay | Admitting: Dermatology

## 2020-08-17 ENCOUNTER — Other Ambulatory Visit: Payer: Self-pay | Admitting: *Deleted

## 2020-08-17 MED ORDER — HUMIRA (2 PEN) 40 MG/0.4ML ~~LOC~~ AJKT: 40.0000 mg | AUTO-INJECTOR | SUBCUTANEOUS | 3 refills | Status: DC

## 2020-08-17 NOTE — Addendum Note (Signed)
Addended by: Wafaa Deemer, Joelene Millin S on: 08/17/2020 11:46 AM   Modules accepted: Orders

## 2020-08-17 NOTE — Telephone Encounter (Signed)
Sent in refill maintence humira to [facility]. Patient notified.

## 2020-08-17 NOTE — Telephone Encounter (Signed)
Patient is calling to say that a new prescription for Humira needs to be sent to the pharmacy ([facility]) at [phone].

## 2020-08-28 ENCOUNTER — Telehealth: Payer: Self-pay

## 2020-08-28 NOTE — Telephone Encounter (Signed)
Phone call to patient to inform her that she is due a TB test before we can refill her Humira.  Patient aware, patient states she can get the TB test done at work tomorrow and she'll fax [place] over the results on Friday.

## 2020-09-04 ENCOUNTER — Encounter: Payer: Self-pay | Admitting: Physician Assistant

## 2020-09-05 ENCOUNTER — Telehealth: Payer: Self-pay | Admitting: *Deleted

## 2020-09-05 NOTE — Telephone Encounter (Signed)
Patient called and is having a "flare". I asked her if she is taking the Humira, per patient she is waiting on [facility] to ship it. I informed patient that we were waiting on her TB results before we were able to sent in the refill for Humira.  We received the Negative Tb results and faxed in the refill for Humira.  Told patient she need to call [facility] and have drug shipped.  Told patient to call us if needed.

## 2020-09-25 ENCOUNTER — Telehealth: Payer: Self-pay

## 2020-09-25 NOTE — Telephone Encounter (Signed)
Prior authorization for Humira done through Cover My Meds.   [facility] is reviewing your PA request and will respond within 24 hours for Medicaid or up to 72 hours for non-Medicaid plans, based on the required timeframe determined by state or federal regulations. To check for an update later, open this request from your dashboard.

## 2020-09-25 NOTE — Telephone Encounter (Signed)
Prior authorization for patient's Humira approved.   This request has been approved.  Please note any additional information provided by [facility] at the bottom of your screen.  [id number] Status:Approved Review Type:Prior Auth Coverage Start Date:08/26/2020 Coverage End Date:09/25/2021 [id number] Status:Approved Review Type:Qty Coverage Start Date:08/26/2020 Coverage End Date:09/25/2023

## 2020-09-25 NOTE — Telephone Encounter (Signed)
Fax received from [place] for a prior authorization on the patient's Humira to be done through Cover My Meds.

## 2020-10-30 ENCOUNTER — Other Ambulatory Visit: Payer: Self-pay | Admitting: Physician Assistant

## 2020-11-23 ENCOUNTER — Encounter: Payer: Self-pay | Admitting: Physician Assistant

## 2020-11-23 ENCOUNTER — Ambulatory Visit (INDEPENDENT_AMBULATORY_CARE_PROVIDER_SITE_OTHER): Payer: Managed Care, Other (non HMO) | Admitting: Physician Assistant

## 2020-11-23 ENCOUNTER — Other Ambulatory Visit: Payer: Self-pay

## 2020-11-23 DIAGNOSIS — L732 Hidradenitis suppurativa: Secondary | ICD-10-CM

## 2020-11-23 DIAGNOSIS — R2231 Localized swelling, mass and lump, right upper limb: Secondary | ICD-10-CM | POA: Diagnosis not present

## 2020-11-23 MED ORDER — TRIAMCINOLONE ACETONIDE 40 MG/ML IJ SUSP
20.0000 mg | Freq: Once | INTRAMUSCULAR | Status: AC
  Administered 2020-11-23: 20 mg

## 2020-12-05 ENCOUNTER — Other Ambulatory Visit: Payer: Self-pay

## 2020-12-05 DIAGNOSIS — L732 Hidradenitis suppurativa: Secondary | ICD-10-CM

## 2020-12-05 MED ORDER — HUMIRA (2 PEN) 40 MG/0.4ML ~~LOC~~ AJKT: 40.0000 mg | AUTO-INJECTOR | SUBCUTANEOUS | 3 refills | Status: DC

## 2020-12-07 ENCOUNTER — Encounter: Payer: Self-pay | Admitting: Physician Assistant

## 2020-12-07 NOTE — Addendum Note (Signed)
Addended by: Robyne Askew R on: 12/07/2020 12:55 PM   Modules accepted: Level of Service

## 2020-12-07 NOTE — Progress Notes (Addendum)
   Follow-Up Visit   Subjective  Barbara Jefferson is a 40 y.o. female who presents for the following: Skin Problem (LEFT HAND AND POST NECK LUMPS X DAYS).   The following portions of the chart were reviewed this encounter and updated as appropriate:  Tobacco  Allergies  Meds  Problems  Med Hx  Surg Hx  Fam Hx      Objective  Well appearing patient in no apparent distress; mood and affect are within normal limits.  All skin waist up examined. and pubic area  Objective  Left Axilla, Pubic, Right Axilla: Flare/boil underarm & groin. Red, swollen and draining yellow exudate  Objective  Right Dorsal Hand & Left Post Neck: Dense 2cm nodule slightly mobile on hand No LAD   Assessment & Plan  Hidradenitis suppurativa (3) Pubic; Left Axilla; Right Axilla  Continue Humira  Intralesional injection - Left Axilla, Pubic, Right Axilla Location: Both axilla and suprapubic  Informed Consent: Discussed risks (infection, pain, bleeding, bruising, thinning of the skin, loss of skin pigment, lack of resolution, and recurrence of lesion) and benefits of the procedure, as well as the alternatives. Informed consent was obtained. Preparation: The area was prepared a standard fashion.  Anesthesia:none  Procedure Details: An intralesional injection was performed with Kenalog 20 mg/cc. 1.5 cc in total were injected.  Total number of injections: <7  Plan: The patient was instructed on post-op care. Recommend OTC analgesia as needed for pain.   Other Related Medications Adalimumab (HUMIRA PEN) 40 MG/0.4ML PNKT  Nodule of skin of right hand Right Dorsal Hand & Left Post Neck  PCP will follow up with xray   I, Rockland Kotarski, PA-C, have reviewed all documentation's for this visit.  The documentation on 12/07/20 for the exam, diagnosis, procedures and orders are all accurate and complete.

## 2020-12-11 ENCOUNTER — Telehealth: Payer: Self-pay | Admitting: *Deleted

## 2020-12-11 DIAGNOSIS — L732 Hidradenitis suppurativa: Secondary | ICD-10-CM

## 2020-12-11 MED ORDER — HUMIRA (2 PEN) 40 MG/0.4ML ~~LOC~~ AJKT: 40.0000 mg | AUTO-INJECTOR | SUBCUTANEOUS | 6 refills | Status: AC

## 2020-12-11 NOTE — Telephone Encounter (Signed)
Refill for Humira sent to [facility]

## 2021-02-01 IMAGING — US US ABDOMEN LIMITED
1 series · 14 of 25 positions shown · non-contrast
Comparison: None.

CLINICAL DATA: Epigastric region pain

EXAM:
ULTRASOUND ABDOMEN LIMITED RIGHT UPPER QUADRANT

[Series 1: us abdomen limited · 0.22mm/px · 14 of 95 slices shown]
[im 1/95]
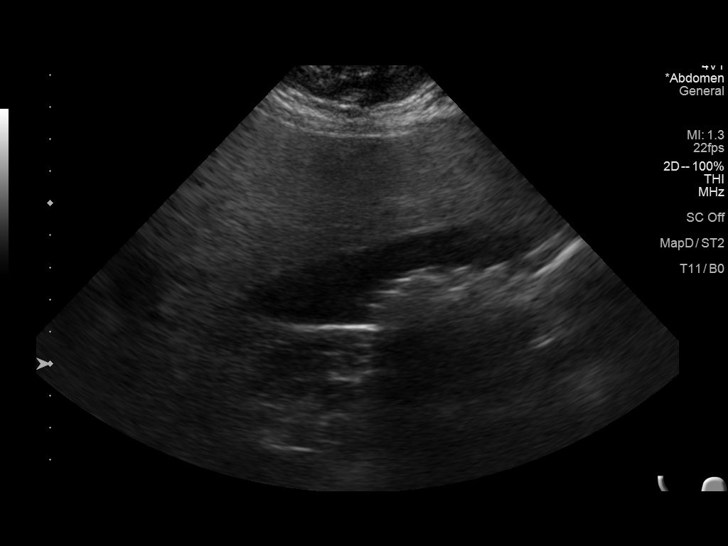
[im 8/95]
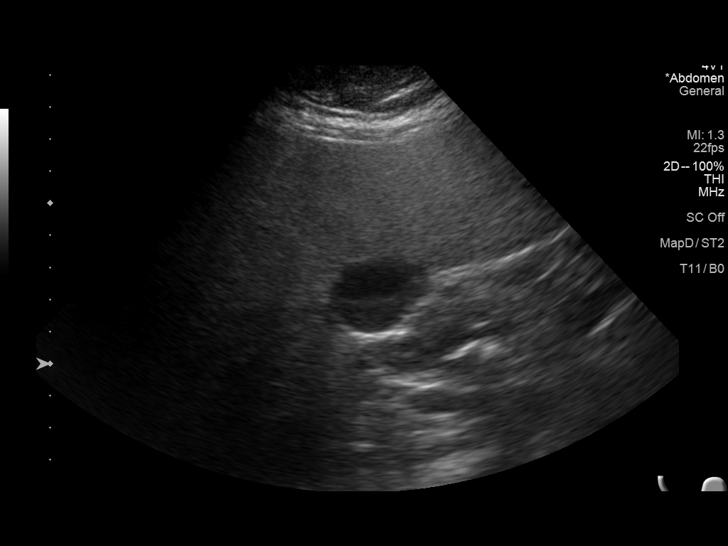
[im 16/95]
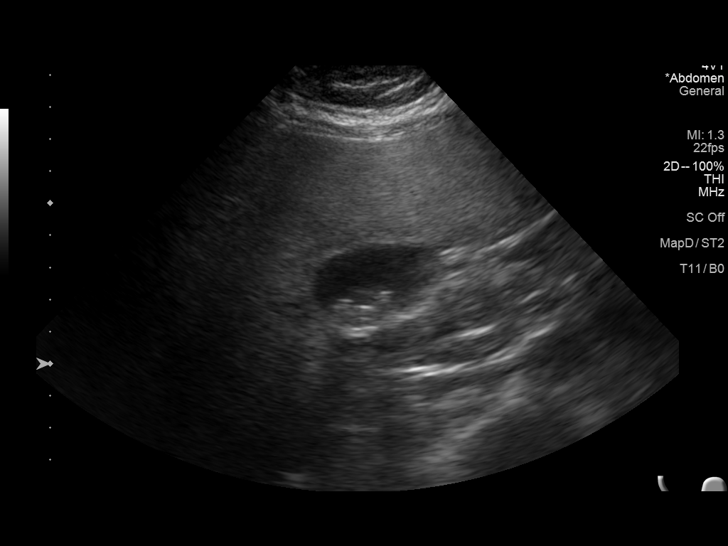
[im 24/95]
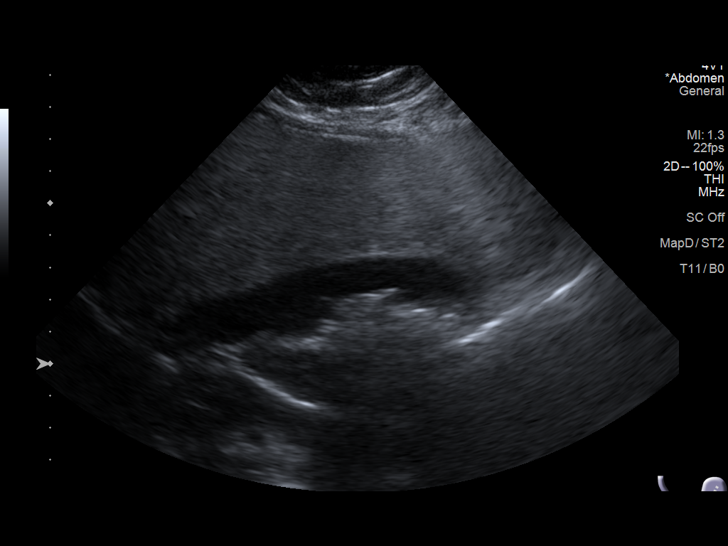
[im 32/95]
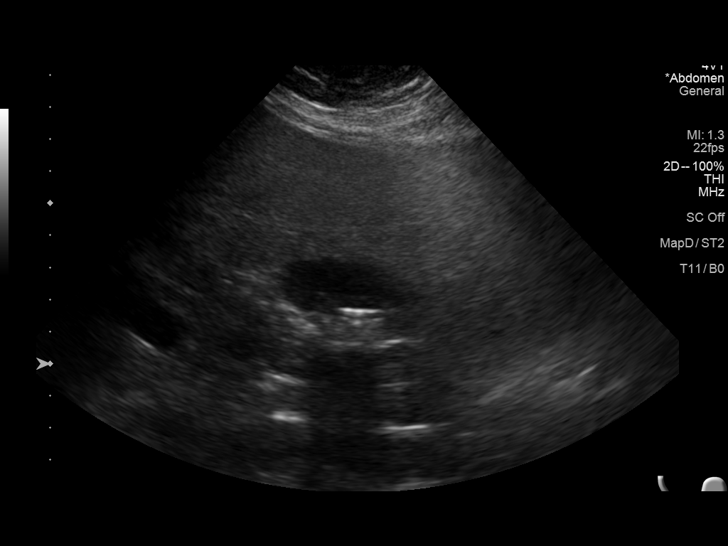
[im 36/95]
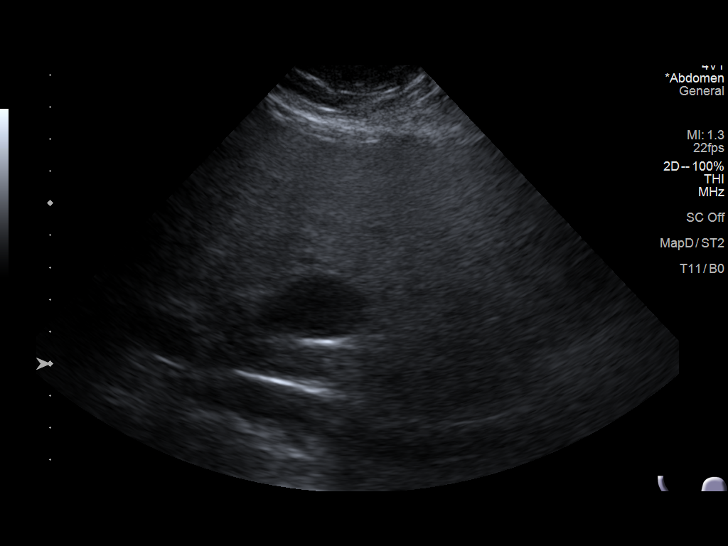
[im 44/95]
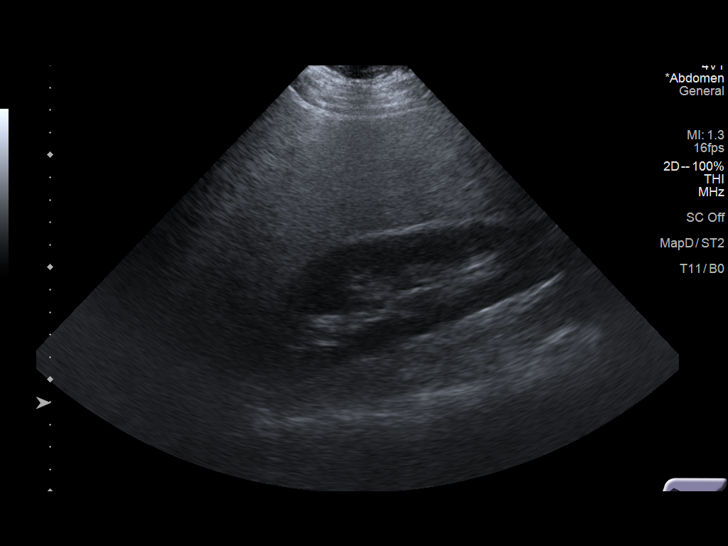
[im 51/95]
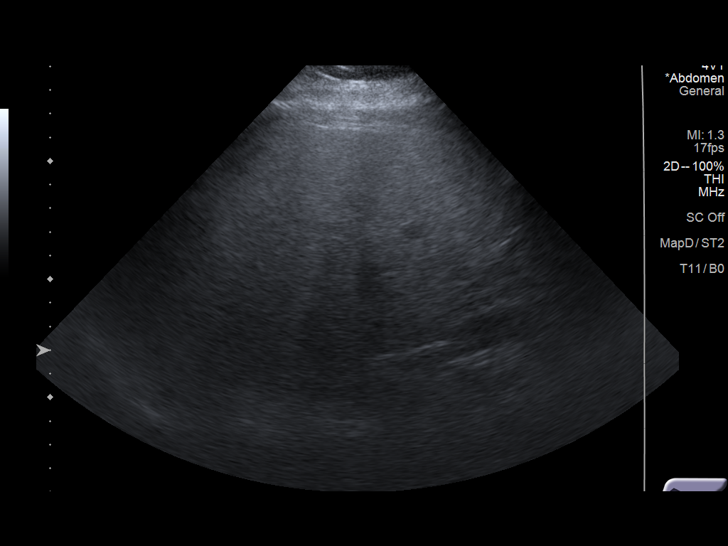
[im 59/95]
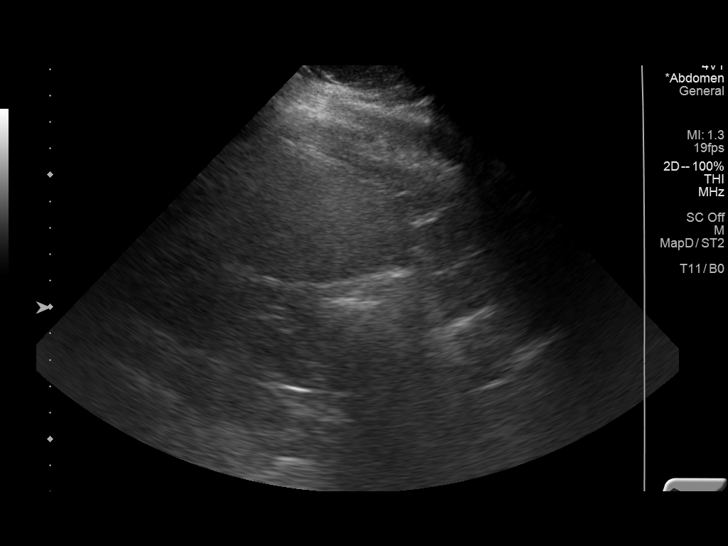
[im 63/95]
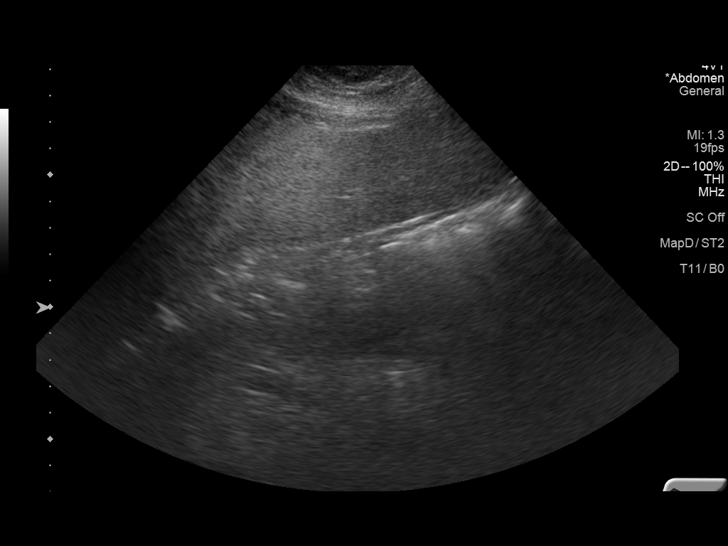
[im 71/95]
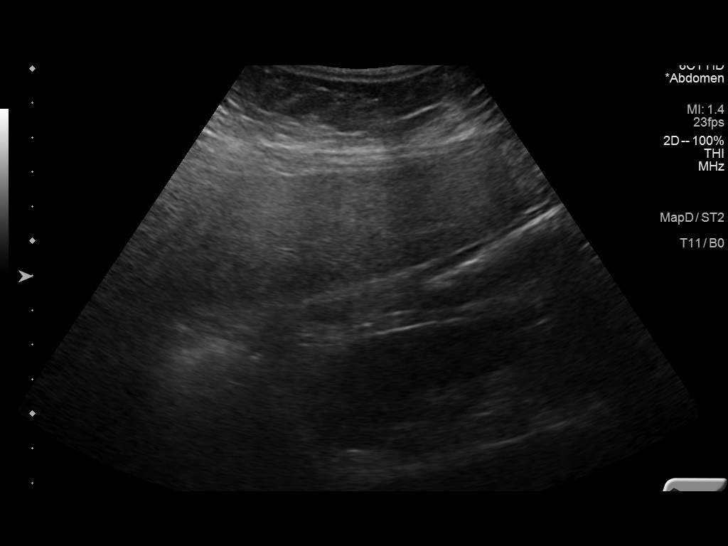
[im 79/95]
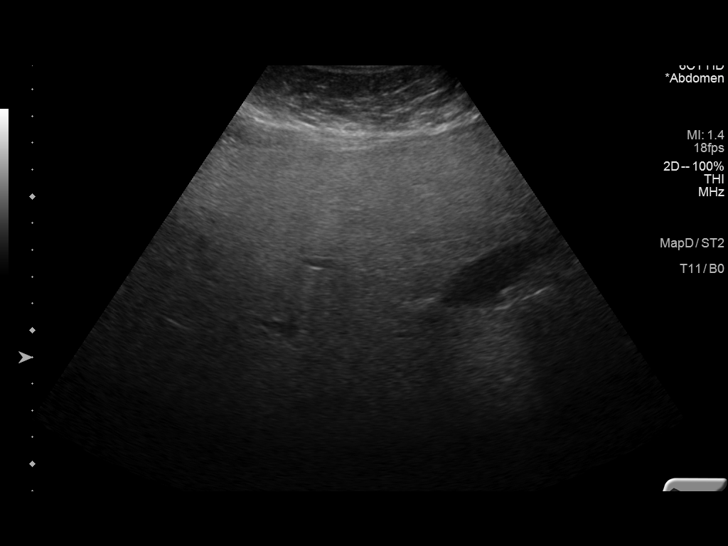
[im 87/95]
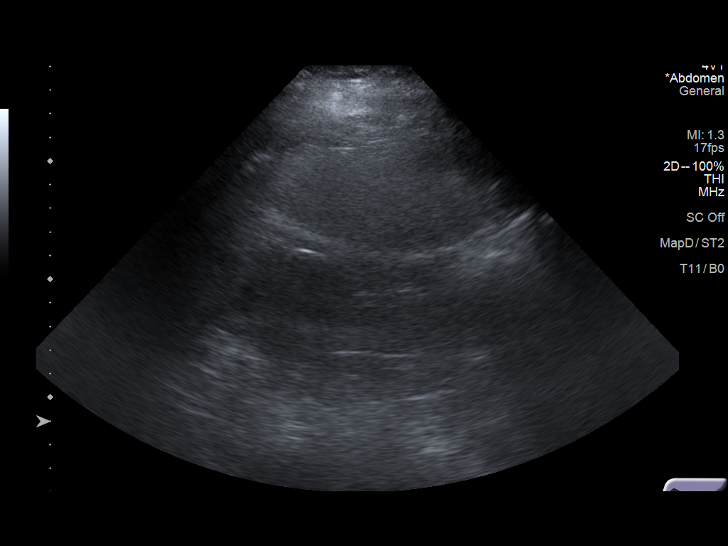
[im 95/95]
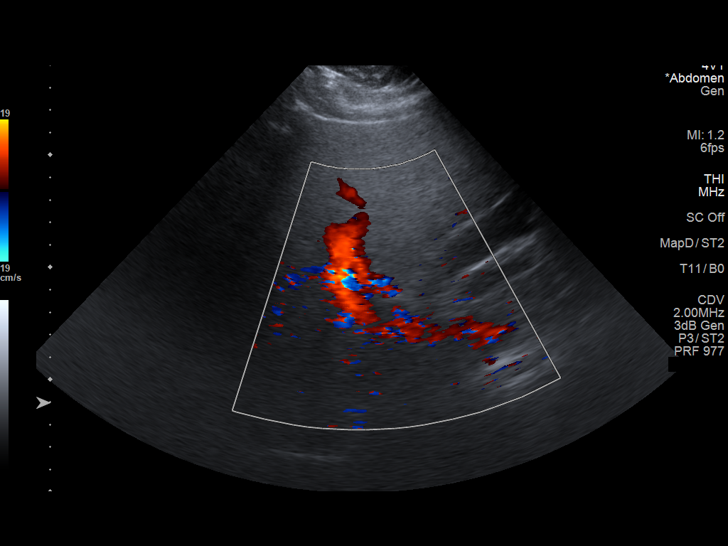

[14 of 25 positions shown; findings below may reference images not displayed]

FINDINGS: Gallbladder:

Within the gallbladder, there are echogenic foci which move and
shadow consistent with cholelithiasis. Largest gallstone measures
1.1 cm in length. There is no gallbladder wall thickening or
pericholecystic fluid. No sonographic Murphy sign noted by
sonographer.

Common bile duct:

Diameter: 4 mm. There is no intrahepatic or extrahepatic biliary
duct dilatation.

Liver:

No focal lesion identified. Liver echogenicity is increased
diffusely. Portal vein is patent on color Doppler imaging with
normal direction of blood flow towards the liver.

Other: None.
IMPRESSION: 1. Cholelithiasis. No evident gallbladder wall thickening or
pericholecystic fluid.

2. Diffuse increase in liver echogenicity, a finding indicative of
hepatic steatosis. While no focal liver lesions are evident on this
study, it must be cautioned that the sensitivity of ultrasound for
detection of focal liver lesions is diminished in this circumstance.

## 2021-03-18 ENCOUNTER — Encounter: Payer: Self-pay | Admitting: Physician Assistant

## 2021-03-18 MED ORDER — SPIRONOLACTONE 100 MG PO TABS: 100.0000 mg | ORAL_TABLET | Freq: Two times a day (BID) | ORAL | 0 refills | Status: DC

## 2021-03-18 NOTE — Telephone Encounter (Signed)
Yes. Stay hydrated

## 2021-03-22 ENCOUNTER — Other Ambulatory Visit (HOSPITAL_COMMUNITY): Payer: Self-pay

## 2021-03-22 MED ORDER — OZEMPIC (0.25 OR 0.5 MG/DOSE) 2 MG/1.5ML ~~LOC~~ SOPN
0.2500 mg | PEN_INJECTOR | SUBCUTANEOUS | 5 refills | Status: AC
  Filled 2021-03-22 (×2): qty 1.5, 42d supply, fill #0
  Filled 2021-04-29: qty 1.5, 42d supply, fill #1

## 2021-04-09 ENCOUNTER — Other Ambulatory Visit: Payer: Self-pay | Admitting: Physician Assistant

## 2021-04-30 ENCOUNTER — Other Ambulatory Visit (HOSPITAL_COMMUNITY): Payer: Self-pay

## 2021-05-28 ENCOUNTER — Other Ambulatory Visit (HOSPITAL_COMMUNITY): Payer: Self-pay

## 2021-05-28 MED ORDER — METFORMIN HCL ER 500 MG PO TB24
500.0000 mg | ORAL_TABLET | Freq: Every day | ORAL | 5 refills | Status: AC
  Filled 2021-05-28: qty 30, 30d supply, fill #0
  Filled 2021-06-26: qty 30, 30d supply, fill #1

## 2021-05-28 MED ORDER — OZEMPIC (1 MG/DOSE) 4 MG/3ML ~~LOC~~ SOPN
1.0000 mg | PEN_INJECTOR | SUBCUTANEOUS | 5 refills | Status: AC
  Filled 2021-05-28 (×2): qty 3, 28d supply, fill #0
  Filled 2021-06-25: qty 3, 28d supply, fill #1

## 2021-06-12 ENCOUNTER — Other Ambulatory Visit (HOSPITAL_COMMUNITY): Payer: Self-pay

## 2021-06-26 ENCOUNTER — Other Ambulatory Visit (HOSPITAL_COMMUNITY): Payer: Self-pay

## 2021-07-09 ENCOUNTER — Telehealth: Payer: Self-pay | Admitting: *Deleted

## 2021-07-09 NOTE — Telephone Encounter (Signed)
Refill request for Spironolactone -denied- patient needs office visit.

## 2021-07-10 ENCOUNTER — Telehealth: Payer: Self-pay | Admitting: *Deleted

## 2021-07-10 NOTE — Telephone Encounter (Signed)
Left message for patient to return our phone call.. we keep getting refill request for spironolactone '100mg'$ - calling to see if patient still takes this medication if so to let her know she needs office visit, unless she is getting refills with [facility].

## 2021-07-17 ENCOUNTER — Other Ambulatory Visit (HOSPITAL_COMMUNITY): Payer: Self-pay

## 2021-08-17 ENCOUNTER — Other Ambulatory Visit: Payer: Self-pay | Admitting: Physician Assistant

## 2022-03-27 ENCOUNTER — Other Ambulatory Visit (HOSPITAL_COMMUNITY): Payer: Self-pay

## 2023-08-28 ENCOUNTER — Encounter (INDEPENDENT_AMBULATORY_CARE_PROVIDER_SITE_OTHER): Payer: Self-pay

## 2023-08-28 ENCOUNTER — Ambulatory Visit (INDEPENDENT_AMBULATORY_CARE_PROVIDER_SITE_OTHER): Payer: Managed Care, Other (non HMO) | Admitting: Otolaryngology

## 2023-08-28 VITALS — Ht 64.0 in | Wt 193.0 lb

## 2023-08-28 DIAGNOSIS — H6981 Other specified disorders of Eustachian tube, right ear: Secondary | ICD-10-CM

## 2023-08-28 DIAGNOSIS — J310 Chronic rhinitis: Secondary | ICD-10-CM

## 2023-08-28 DIAGNOSIS — H9011 Conductive hearing loss, unilateral, right ear, with unrestricted hearing on the contralateral side: Secondary | ICD-10-CM

## 2023-09-01 DIAGNOSIS — H9011 Conductive hearing loss, unilateral, right ear, with unrestricted hearing on the contralateral side: Secondary | ICD-10-CM | POA: Insufficient documentation

## 2023-09-01 DIAGNOSIS — H6981 Other specified disorders of Eustachian tube, right ear: Secondary | ICD-10-CM | POA: Insufficient documentation

## 2023-09-01 NOTE — Progress Notes (Signed)
Patient ID: Barbara Jefferson, female   DOB: 10/01/1981, 42 y.o.   MRN: [id number]  Follow-up: Right ear conductive hearing loss, eustachian tube dysfunction  HPI: The patient is a 42 year old female who returns today for her follow-up evaluation.  She was last seen 1 month ago.  At that time, she was complaining of clogging sensation in her ears and muffled hearing.  She was noted to have bilateral eustachian tube dysfunction, worse on the right side.  Mild right ear conductive hearing loss was also noted.  The patient was treated with Flonase and Valsalva exercise.  The patient returns today complaining of persistent difficulty popping her ears, especially the right side.  Her hearing is still muffled.  She denies any otalgia, otorrhea, or vertigo.  Exam: General: Communicates without difficulty, well nourished, no acute distress. Head: Normocephalic, no evidence injury, no tenderness, facial buttresses intact without stepoff. Face/sinus: No tenderness to palpation and percussion. Facial movement is normal and symmetric. Eyes: PERRL, EOMI. No scleral icterus, conjunctivae clear. Neuro: CN II exam reveals vision grossly intact.  No nystagmus at any point of gaze. Ears: Auricles well formed without lesions.  Ear canals are intact without mass or lesion.  No erythema or edema is appreciated.  The TMs are intact but retracted. Nose: External evaluation reveals normal support and skin without lesions.  Dorsum is intact.  Anterior rhinoscopy reveals congested mucosa over anterior aspect of inferior turbinates and intact septum.  No purulence noted. Oral:  Oral cavity and oropharynx are intact, symmetric, without erythema or edema.  Mucosa is moist without lesions. Neck: Full range of motion without pain.  There is no significant lymphadenopathy.  No masses palpable.  Thyroid bed within normal limits to palpation.  Parotid glands and submandibular glands equal bilaterally without mass.  Trachea is midline. Neuro:  CN  2-12 grossly intact.   Assessment: 1.  Chronic rhinitis with nasal mucosal congestion. 2.  Eustachian tube dysfunction, worse on the right side. 3.  Right ear conductive hearing loss.  Plan: 1.  The physical exam findings are reviewed with the patient. 2.  Continue with Flonase nasal spray and Valsalva exercise daily. 3.  The option of right myringotomy and tube placement is also discussed.  The risk, benefits, and details of the procedure are reviewed. 4.  The patient would like to proceed with medical management.  She will return for reevaluation in 2 months.

## 2023-11-02 ENCOUNTER — Ambulatory Visit (INDEPENDENT_AMBULATORY_CARE_PROVIDER_SITE_OTHER): Payer: Managed Care, Other (non HMO) | Admitting: Otolaryngology

## 2023-11-02 VITALS — BP 120/81 | HR 86 | Ht 64.0 in | Wt 193.0 lb

## 2023-11-02 DIAGNOSIS — H903 Sensorineural hearing loss, bilateral: Secondary | ICD-10-CM

## 2023-11-02 DIAGNOSIS — J310 Chronic rhinitis: Secondary | ICD-10-CM | POA: Diagnosis not present

## 2023-11-02 DIAGNOSIS — H6983 Other specified disorders of Eustachian tube, bilateral: Secondary | ICD-10-CM | POA: Diagnosis not present

## 2023-11-02 DIAGNOSIS — J343 Hypertrophy of nasal turbinates: Secondary | ICD-10-CM | POA: Diagnosis not present

## 2023-11-02 DIAGNOSIS — R0981 Nasal congestion: Secondary | ICD-10-CM | POA: Diagnosis not present

## 2023-11-02 DIAGNOSIS — H6523 Chronic serous otitis media, bilateral: Secondary | ICD-10-CM | POA: Insufficient documentation

## 2023-11-02 NOTE — Progress Notes (Signed)
Patient ID: Barbara Jefferson, female   DOB: 1981-06-08, 42 y.o.   MRN: [id number]  Follow-up: Eustachian tube dysfunction, hearing loss, chronic nasal congestion  HPI: The patient is a 42 year old female who returns today for her follow-up evaluation.  She was last seen in September 2024.  At that time, she was noted to have bilateral eustachian tube dysfunction, bilateral sensorineural hearing loss, with conductive component on the right side, and chronic rhinitis with bilateral inferior turbinate hypertrophy.  The patient was treated with Flonase, Zyrtec, Singulair, and Valsalva exercise.  The patient returns today reporting initial improvement in her symptoms.  However, she had an upper respiratory infection 2 weeks ago.  Since the infection, she has noted bilateral muffled hearing.  Both ears are currently clogged.  She is having significant difficulty auto insufflating her middle ear spaces.  She continues to have chronic nasal congestion.  Exam: General: Communicates without difficulty, well nourished, no acute distress. Head: Normocephalic, no evidence injury, no tenderness, facial buttresses intact without stepoff. Face/sinus: No tenderness to palpation and percussion. Facial movement is normal and symmetric. Eyes: PERRL, EOMI. No scleral icterus, conjunctivae clear. Neuro: CN II exam reveals vision grossly intact.  No nystagmus at any point of gaze. Ears: Auricles well formed without lesions.  Ear canals are intact without mass or lesion.  No erythema or edema is appreciated.  The TMs are intact with bilateral middle ear fluid. Nose: External evaluation reveals normal support and skin without lesions.  Dorsum is intact.  Anterior rhinoscopy reveals congested mucosa over anterior aspect of inferior turbinates and intact septum.  No purulence noted. Oral:  Oral cavity and oropharynx are intact, symmetric, without erythema or edema.  Mucosa is moist without lesions. Neck: Full range of motion without pain.   There is no significant lymphadenopathy.  No masses palpable.  Thyroid bed within normal limits to palpation.  Parotid glands and submandibular glands equal bilaterally without mass.  Trachea is midline. Neuro:  CN 2-12 grossly intact.    Assessment: 1.  Bilateral eustachian tube dysfunction, and bilateral middle ear effusion. 2.  Chronic rhinitis with nasal mucosal congestion and bilateral inferior turbinate hypertrophy. 3.  History of bilateral sensorineural hearing loss, with likely additional bilateral conductive hearing loss.  Plan: 1.  The physical exam findings are reviewed with the patient. 2.  Continue with Flonase nasal spray and Valsalva exercise daily. 3.  If she continues to be symptomatic, she may benefit from myringotomy and tube placement.  We will likely start with the right ear, which is the more symptomatic ear.

## 2023-11-11 ENCOUNTER — Ambulatory Visit (INDEPENDENT_AMBULATORY_CARE_PROVIDER_SITE_OTHER): Payer: Managed Care, Other (non HMO) | Admitting: Otolaryngology

## 2023-11-11 ENCOUNTER — Encounter (INDEPENDENT_AMBULATORY_CARE_PROVIDER_SITE_OTHER): Payer: Self-pay

## 2023-11-11 VITALS — BP 128/85 | HR 81 | Ht 64.0 in | Wt 195.0 lb

## 2023-11-11 DIAGNOSIS — H6523 Chronic serous otitis media, bilateral: Secondary | ICD-10-CM

## 2023-11-11 DIAGNOSIS — H6981 Other specified disorders of Eustachian tube, right ear: Secondary | ICD-10-CM | POA: Diagnosis not present

## 2023-11-11 DIAGNOSIS — H9011 Conductive hearing loss, unilateral, right ear, with unrestricted hearing on the contralateral side: Secondary | ICD-10-CM

## 2023-11-11 DIAGNOSIS — H6591 Unspecified nonsuppurative otitis media, right ear: Secondary | ICD-10-CM | POA: Diagnosis not present

## 2023-11-11 DIAGNOSIS — H6983 Other specified disorders of Eustachian tube, bilateral: Secondary | ICD-10-CM

## 2023-11-12 NOTE — Progress Notes (Signed)
 Patient ID: Barbara Jefferson, female   DOB: April 04, 1981, 43 y.o.   MRN: [id number]  Procedure: Right myringotomy and tube placement.   Indication: Eustachian tube dysfunction and middle ear effusion, not responding to medical treatment.    Descriptions: The patient was placed supine on the exam table.  Under the operating microscope, the right ear canal was cleaned of all cerumen.  1% lidocaine  was injected into all four quadrants of the ear canal. After adequate local anesthesia was achieved, a standard myringotomy incision was made at the anterior inferior quadrant of the tympanic membrane.  A moderate amount of serous fluid was suctioned from behind the tympanic membrane.  A collar button tube was placed without difficulty.  The patient tolerated the procedure well.    Follow-up care: Dry ear precaution. The patient will return for reevaluation in approximately one month.

## 2023-12-11 ENCOUNTER — Telehealth (INDEPENDENT_AMBULATORY_CARE_PROVIDER_SITE_OTHER): Payer: Self-pay | Admitting: Otolaryngology

## 2023-12-11 NOTE — Telephone Encounter (Signed)
 Reminder Call: Date: 12/14/2023 Status: Sch  Time: 3:50 PM [address] [place], [place] [address]  12/11/2023 8:01 AM -Text Yes

## 2023-12-14 ENCOUNTER — Encounter (INDEPENDENT_AMBULATORY_CARE_PROVIDER_SITE_OTHER): Payer: Self-pay

## 2023-12-14 ENCOUNTER — Ambulatory Visit (INDEPENDENT_AMBULATORY_CARE_PROVIDER_SITE_OTHER): Payer: Managed Care, Other (non HMO)

## 2023-12-14 ENCOUNTER — Ambulatory Visit (INDEPENDENT_AMBULATORY_CARE_PROVIDER_SITE_OTHER): Payer: Managed Care, Other (non HMO) | Admitting: Audiology

## 2023-12-14 VITALS — BP 121/74 | HR 77 | Ht 64.0 in | Wt 194.0 lb

## 2023-12-14 DIAGNOSIS — H7201 Central perforation of tympanic membrane, right ear: Secondary | ICD-10-CM

## 2023-12-14 DIAGNOSIS — R0981 Nasal congestion: Secondary | ICD-10-CM | POA: Diagnosis not present

## 2023-12-14 DIAGNOSIS — J310 Chronic rhinitis: Secondary | ICD-10-CM

## 2023-12-14 DIAGNOSIS — Z9629 Presence of other otological and audiological implants: Secondary | ICD-10-CM | POA: Diagnosis not present

## 2023-12-14 DIAGNOSIS — H9042 Sensorineural hearing loss, unilateral, left ear, with unrestricted hearing on the contralateral side: Secondary | ICD-10-CM | POA: Diagnosis not present

## 2023-12-14 DIAGNOSIS — H6983 Other specified disorders of Eustachian tube, bilateral: Secondary | ICD-10-CM | POA: Diagnosis not present

## 2023-12-14 DIAGNOSIS — H90A31 Mixed conductive and sensorineural hearing loss, unilateral, right ear with restricted hearing on the contralateral side: Secondary | ICD-10-CM

## 2023-12-14 DIAGNOSIS — H903 Sensorineural hearing loss, bilateral: Secondary | ICD-10-CM

## 2023-12-14 NOTE — Progress Notes (Signed)
  [address], [address] [place], [place] [address] [phone]  Audiological Evaluation    Name: Barbara Jefferson     DOB:   1981/03/11      MRN:   [id number]                                                                                     Service Date: 12/14/2023     Accompanied by: unaccompanied    Patient comes today after Dr. Darlin Ehrlich, ENT sent a referral for a hearing evaluation due to concerns with ear fullness.   Symptoms Yes Details  Hearing loss  []    Tinnitus  [x]  Sometimes  Ear pain/ infections/pressure  [x]  After tubes was lace din the right side the pressure was relieved. Now has right sided sinus pressure and left ear pressure.  Balance problems  []    Noise exposure history  []    Previous ear surgeries  [x]  Had a pressure equalizing tube placed 1 month ago in the right ear by Dr. Darlin Ehrlich  Family history of hearing loss  []    Amplification  []    Other  []      Otoscopy: Right ear: Clear external ear canals and notable landmarks visualized on the tympanic membrane. Left ear:  Clear external ear canals and notable landmarks visualized on the tympanic membrane.  Tympanometry: Right ear: Type B- Large external ear canal volume with no middle ear pressure peak or tympanic membrane compliance Left ear: Normal external ear canal volume with negative middle ear pressure and reduced tympanic membrane compliance   Pure tone Audiometry: Right ear- Borderline normal to moderate essentially mixed hearing loss from 250 Hz - 8000 Hz. Left ear-  Borderline normal to moderate sensorineural hearing loss from 250 Hz - 8000 Hz.  Speech Audiometry: Right ear- Speech Reception Threshold (SRT) was obtained at 25 dBHL. Left ear-Speech Reception Threshold (SRT) was obtained at 25 dBHL.   Word Recognition Score Tested using NU-6 (MLV) Right ear: 100% was obtained at a presentation level of 65 dBHL with contralateral masking which is deemed as  excellent. Left ear: 100% was obtained at a  presentation level of 65 dBHL with contralateral masking which is deemed as  excellent.   The hearing test results were completed under headphones and results are deemed to be of good reliability. Test technique:  conventional       Recommendations: Follow up with ENT as scheduled for today. Repeat audiogram after medical care.   [name], AUD

## 2023-12-15 DIAGNOSIS — H7201 Central perforation of tympanic membrane, right ear: Secondary | ICD-10-CM | POA: Insufficient documentation

## 2023-12-15 DIAGNOSIS — H903 Sensorineural hearing loss, bilateral: Secondary | ICD-10-CM | POA: Insufficient documentation

## 2023-12-15 NOTE — Progress Notes (Signed)
Patient ID: Barbara Jefferson, female   DOB: Oct 06, 1981, 43 y.o.   MRN: [id number]  Follow-up: Eustachian tube dysfunction, hearing loss, chronic nasal congestion  HPI: The patient is a 43 year old female who returns today for follow-up evaluation.  She was previously seen for bilateral eustachian tube dysfunction, bilateral sensorineural hearing loss, right middle ear effusion, and right ear conductive hearing loss.  She was treated with Flonase nasal spray and Valsalva exercise.  She also underwent right myringotomy and tube placement 1 month ago.  The patient returns today reporting improvement in her right ear hearing.  However, she had a recent sinusitis, with facial pain, facial pressure, and dental pain.  She was treated with Augmentin and prednisone.  Her facial pain has resolved.  Exam: General: Communicates without difficulty, well nourished, no acute distress. Head: Normocephalic, no evidence injury, no tenderness, facial buttresses intact without stepoff. Face/sinus: No tenderness to palpation and percussion. Facial movement is normal and symmetric. Eyes: PERRL, EOMI. No scleral icterus, conjunctivae clear. Neuro: CN II exam reveals vision grossly intact.  No nystagmus at any point of gaze. Ears: Auricles well formed without lesions.  Ear canals are intact without mass or lesion.  No erythema or edema is appreciated.  The right tube is in place and patent.  No drainage is noted.  The left tympanic membrane is intact and mobile.  Nose: External evaluation reveals normal support and skin without lesions.  Dorsum is intact.  Anterior rhinoscopy reveals congested mucosa over anterior aspect of inferior turbinates and intact septum.  No purulence noted. Oral:  Oral cavity and oropharynx are intact, symmetric, without erythema or edema.  Mucosa is moist without lesions. Neck: Full range of motion without pain.  There is no significant lymphadenopathy.  No masses palpable.  Thyroid bed within normal limits to  palpation.  Parotid glands and submandibular glands equal bilaterally without mass.  Trachea is midline. Neuro:  CN 2-12 grossly intact.   Her hearing test shows bilateral mild sensorineural hearing loss.  Assessment: 1.  The right ventilating tube is in place and patent.  No drainage is noted. 2.  Chronic rhinitis with nasal mucosal congestion.  Her recent acute sinusitis has resolved.  No purulent drainage is noted today. 3.  Bilateral eustachian tube dysfunction.  Plan: 1.  The physical exam findings and the hearing test results are reviewed with the patient. 2.  Continue with Flonase nasal spray 2 sprays each nostril daily. 3.  Dry ear precautions on the right side. 4.  The patient will return for reevaluation in 3 months.

## 2023-12-29 ENCOUNTER — Other Ambulatory Visit (INDEPENDENT_AMBULATORY_CARE_PROVIDER_SITE_OTHER): Payer: Self-pay | Admitting: Otolaryngology

## 2023-12-29 ENCOUNTER — Telehealth (INDEPENDENT_AMBULATORY_CARE_PROVIDER_SITE_OTHER): Payer: Self-pay | Admitting: Otolaryngology

## 2023-12-29 MED ORDER — IPRATROPIUM BROMIDE 0.06 % NA SOLN: 2.0000 | Freq: Two times a day (BID) | NASAL | 12 refills | Status: AC | PRN

## 2023-12-29 NOTE — Telephone Encounter (Signed)
 Patient called and was c/o nasal congestion and  PND . Dr. Suszanne Conners called in Atrovent nasal spray [facility] in [place]. She will f/u on 03/15/2024 sooner if needed.

## 2024-01-07 ENCOUNTER — Other Ambulatory Visit (INDEPENDENT_AMBULATORY_CARE_PROVIDER_SITE_OTHER): Payer: Self-pay | Admitting: Otolaryngology

## 2024-01-07 ENCOUNTER — Telehealth (INDEPENDENT_AMBULATORY_CARE_PROVIDER_SITE_OTHER): Payer: Self-pay | Admitting: Otolaryngology

## 2024-01-07 DIAGNOSIS — J324 Chronic pansinusitis: Secondary | ICD-10-CM

## 2024-01-07 NOTE — Telephone Encounter (Signed)
 Patient called yesterday and stated that she is having a thick greenish nasal drainage. Dr. Suszanne Conners placed  an order for a sinus CT. Patient is aware .

## 2024-01-13 ENCOUNTER — Ambulatory Visit (HOSPITAL_COMMUNITY)
Admission: RE | Admit: 2024-01-13 | Discharge: 2024-01-13 | Disposition: A | Discharge status: Home / Self Care | Source: Ambulatory Visit | Attending: Otolaryngology | Admitting: Otolaryngology

## 2024-01-13 DIAGNOSIS — J324 Chronic pansinusitis: Secondary | ICD-10-CM | POA: Insufficient documentation

## 2024-01-19 ENCOUNTER — Other Ambulatory Visit (INDEPENDENT_AMBULATORY_CARE_PROVIDER_SITE_OTHER): Payer: Self-pay | Admitting: Otolaryngology

## 2024-01-19 ENCOUNTER — Ambulatory Visit (INDEPENDENT_AMBULATORY_CARE_PROVIDER_SITE_OTHER): Admitting: Otolaryngology

## 2024-01-19 VITALS — BP 137/94 | HR 80 | Ht 64.0 in | Wt 198.0 lb

## 2024-01-19 DIAGNOSIS — J338 Other polyp of sinus: Secondary | ICD-10-CM

## 2024-01-19 DIAGNOSIS — J321 Chronic frontal sinusitis: Secondary | ICD-10-CM | POA: Insufficient documentation

## 2024-01-19 DIAGNOSIS — J322 Chronic ethmoidal sinusitis: Secondary | ICD-10-CM | POA: Diagnosis not present

## 2024-01-19 DIAGNOSIS — J320 Chronic maxillary sinusitis: Secondary | ICD-10-CM

## 2024-01-19 DIAGNOSIS — J342 Deviated nasal septum: Secondary | ICD-10-CM | POA: Diagnosis not present

## 2024-01-19 DIAGNOSIS — J343 Hypertrophy of nasal turbinates: Secondary | ICD-10-CM

## 2024-01-19 MED ORDER — PREDNISONE 10 MG (21) PO TBPK: ORAL_TABLET | ORAL | 0 refills | Status: AC

## 2024-01-19 MED ORDER — LEVOFLOXACIN 500 MG PO TABS: 500.0000 mg | ORAL_TABLET | Freq: Every day | ORAL | 0 refills | Status: AC

## 2024-01-19 NOTE — Progress Notes (Unsigned)
 Follow-up: Chronic rhinosinusitis, right-sided facial pain, nasal obstruction  HPI: The patient is a 43 year old female who returns today complaining of severe right-sided facial pain, chronic nasal obstruction, and chronic sinusitis.  The patient also has a history of right righted eustachian tube dysfunction, requiring right ventilating tube placement.  She was also treated with Flonase nasal spray.  According to the patient, she had an episode of acute sinusitis in January, requiring treatment with Augmentin and prednisone.  Her facial pain and nasal congestion improved temporarily.  However, over the past 2 months, she has noted persistent right-sided facial pain and pressure, chronic nasal obstruction, and chronic nasal drainage.  She has also noted frequent foul-smelling odor.  She underwent a sinus CT scan.  The CT showed near complete opacifications of her right frontal, maxillary, and ethmoid sinuses.  The right ostiomeatal complex was completely obstructed.  The patient presents today requesting more definitive treatment.  Exam: General: Communicates without difficulty, well nourished, no acute distress. Head: Normocephalic, no evidence injury, no tenderness, facial buttresses intact without stepoff. Face/sinus: No tenderness to palpation and percussion. Facial movement is normal and symmetric. Eyes: PERRL, EOMI. No scleral icterus, conjunctivae clear. Neuro: CN II exam reveals vision grossly intact.  No nystagmus at any point of gaze. Ears: Auricles well formed without lesions.  Ear canals are intact without mass or lesion.  No erythema or edema is appreciated.  The TMs are intact without fluid. Nose: External evaluation reveals normal support and skin without lesions.  Dorsum is intact.  Anterior rhinoscopy reveals congested mucosa over anterior aspect of inferior turbinates and deviated septum.  Polypoid tissue is noted to obstruct the right middle meatus, with mucopurulent drainage noted within  the right nasal cavity.  Oral:  Oral cavity and oropharynx are intact, symmetric, without erythema or edema.  Mucosa is moist without lesions. Neck: Full range of motion without pain.  There is no significant lymphadenopathy.  No masses palpable.  Thyroid bed within normal limits to palpation.  Parotid glands and submandibular glands equal bilaterally without mass.  Trachea is midline. Neuro:  CN 2-12 grossly intact.   Assessment: 1.  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 2.  Frequent recurrent exacerbations.  Mucopurulent drainage is noted today. 3.  Nasal septal deviation and bilateral inferior turbinate hypertrophy, obstructing more than 95% of her nasal passageways.  The patient has been using Flonase nasal spray and nasal saline irrigation daily for the past 3 months.  Plan: 1.  The physical exam findings and the CT images are extensively reviewed with the patient. 2.  Continue with Flonase nasal spray and nasal saline irrigation daily. 3.  Levofloxacin 500 mg p.o. daily for 14 days. 4.  Prednisone Dosepak for 6 days. 5.  If the patient continues to be symptomatic, she may benefit from surgical intervention with right-sided endoscopic sinus surgery, septoplasty, and turbinate reduction.  The risk, benefits, and details of the procedures are reviewed.  Questions are invited and answered.

## 2024-01-19 NOTE — Progress Notes (Signed)
 Patient ID: Barbara Jefferson, female   DOB: 1981-08-13, 43 y.o.   MRN: [id number]  Follow-up: Chronic rhinosinusitis, right-sided facial pain, nasal obstruction   HPI: The patient is a 43 year old female who returns today complaining of severe right-sided facial pain, chronic nasal obstruction, and chronic sinusitis.  The patient also has a history of right righted eustachian tube dysfunction, requiring right ventilating tube placement.  She was also treated with Flonase nasal spray.  According to the patient, she had an episode of acute sinusitis in January, requiring treatment with Augmentin and prednisone.  Her facial pain and nasal congestion improved temporarily.  However, over the past 2 months, she has noted persistent right-sided facial pain and pressure, chronic nasal obstruction, and chronic nasal drainage.  She has also noted frequent foul-smelling odor.  She underwent a sinus CT scan.  The CT showed near complete opacifications of her right frontal, maxillary, and ethmoid sinuses.  The right ostiomeatal complex was completely obstructed.  The patient presents today requesting more definitive treatment.   Exam: General: Communicates without difficulty, well nourished, no acute distress. Head: Normocephalic, no evidence injury, no tenderness, facial buttresses intact without stepoff. Face/sinus: No tenderness to palpation and percussion. Facial movement is normal and symmetric. Eyes: PERRL, EOMI. No scleral icterus, conjunctivae clear. Neuro: CN II exam reveals vision grossly intact.  No nystagmus at any point of gaze. Ears: Auricles well formed without lesions.  Ear canals are intact without mass or lesion.  No erythema or edema is appreciated.  The TMs are intact without fluid. Nose: External evaluation reveals normal support and skin without lesions.  Dorsum is intact.  Anterior rhinoscopy reveals congested mucosa over anterior aspect of inferior turbinates and deviated septum.  Polypoid tissue is  noted to obstruct the right middle meatus, with mucopurulent drainage noted within the right nasal cavity.  Oral:  Oral cavity and oropharynx are intact, symmetric, without erythema or edema.  Mucosa is moist without lesions. Neck: Full range of motion without pain.  There is no significant lymphadenopathy.  No masses palpable.  Thyroid bed within normal limits to palpation.  Parotid glands and submandibular glands equal bilaterally without mass.  Trachea is midline. Neuro:  CN 2-12 grossly intact.    Assessment: 1.  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 2.  Frequent recurrent exacerbations.  Mucopurulent drainage is noted today. 3.  Nasal septal deviation and bilateral inferior turbinate hypertrophy, obstructing more than 95% of her nasal passageways.  The patient has been using Flonase nasal spray and nasal saline irrigation daily for the past 3 months.   Plan: 1.  The physical exam findings and the CT images are extensively reviewed with the patient. 2.  Continue with Flonase nasal spray and nasal saline irrigation daily. 3.  Levofloxacin 500 mg p.o. daily for 14 days. 4.  Prednisone Dosepak for 6 days. 5.  If the patient continues to be symptomatic, she may benefit from surgical intervention with right-sided endoscopic sinus surgery, septoplasty, and turbinate reduction.  The risk, benefits, and details of the procedures are reviewed.  Questions are invited and answered.

## 2024-02-09 DIAGNOSIS — J320 Chronic maxillary sinusitis: Secondary | ICD-10-CM

## 2024-02-09 DIAGNOSIS — J338 Other polyp of sinus: Secondary | ICD-10-CM | POA: Diagnosis not present

## 2024-02-09 DIAGNOSIS — J321 Chronic frontal sinusitis: Secondary | ICD-10-CM | POA: Diagnosis not present

## 2024-02-09 DIAGNOSIS — J342 Deviated nasal septum: Secondary | ICD-10-CM | POA: Diagnosis not present

## 2024-02-09 DIAGNOSIS — J3489 Other specified disorders of nose and nasal sinuses: Secondary | ICD-10-CM

## 2024-02-09 DIAGNOSIS — J343 Hypertrophy of nasal turbinates: Secondary | ICD-10-CM

## 2024-02-09 DIAGNOSIS — J322 Chronic ethmoidal sinusitis: Secondary | ICD-10-CM | POA: Diagnosis not present

## 2024-02-09 HISTORY — PX: OTHER SURGICAL HISTORY: SHX169

## 2024-02-12 ENCOUNTER — Encounter (INDEPENDENT_AMBULATORY_CARE_PROVIDER_SITE_OTHER): Payer: Self-pay

## 2024-02-12 ENCOUNTER — Ambulatory Visit (INDEPENDENT_AMBULATORY_CARE_PROVIDER_SITE_OTHER): Admitting: Otolaryngology

## 2024-02-12 VITALS — Ht 64.0 in | Wt 200.0 lb

## 2024-02-12 DIAGNOSIS — J320 Chronic maxillary sinusitis: Secondary | ICD-10-CM

## 2024-02-12 DIAGNOSIS — J338 Other polyp of sinus: Secondary | ICD-10-CM

## 2024-02-12 DIAGNOSIS — J322 Chronic ethmoidal sinusitis: Secondary | ICD-10-CM

## 2024-02-12 DIAGNOSIS — J321 Chronic frontal sinusitis: Secondary | ICD-10-CM

## 2024-02-12 NOTE — Progress Notes (Signed)
 Patient ID: Barbara Jefferson, female   DOB: 1981/05/30, 43 y.o.   MRN: [id number]  Follow-up: Chronic right maxillary, frontal, and ethmoid sinusitis and polyposis  Procedure: Right nasal/sinus debridement status post endoscopic sinus surgery.  Indication: The patient previously underwent right endoscopic sinus surgery to treat her chronic right-sided sinusitis and polyposis.  The patient returns today complaining of significant nasal congestion and crusting.  She had a moderate amount of bleeding from the nasal cavities.  Currently the patient denies any facial pain, fever, or visual change.  Her surgical pathology is benign.  Anesthesia: Topical Xylocaine and Neo-Synephrine.  Description: The patient is placed upright in the exam chair.  The right nasal cavity is sprayed with topical Xylocaine and Neo-Synephrine.  A 0 rigid endoscope is used for the examination. The scope is first advanced past the right nostril into the right nasal cavity. A significant amount of crusting and blood clots are noted within the right nasal cavity and  the maxillary/ethmoid cavities.  The crusting and blood clots are removed with suction catheters and alligator forceps, which are inserted in parallel with the rigid endoscope.  After the debridement procedure, the maxillary antrum and the ethmoid cavities are noted to be widely patent.  The frontal recess is also patent.  The patient tolerated the procedure well.  Follow up care: The patient is instructed to perform daily nasal saline irrigation.  The patient will return for re-evaluation in approximately 3 weeks.

## 2024-03-08 ENCOUNTER — Ambulatory Visit (INDEPENDENT_AMBULATORY_CARE_PROVIDER_SITE_OTHER)

## 2024-03-08 ENCOUNTER — Encounter (INDEPENDENT_AMBULATORY_CARE_PROVIDER_SITE_OTHER): Payer: Self-pay

## 2024-03-08 VITALS — Ht 64.0 in | Wt 195.0 lb

## 2024-03-08 DIAGNOSIS — J321 Chronic frontal sinusitis: Secondary | ICD-10-CM

## 2024-03-08 DIAGNOSIS — J320 Chronic maxillary sinusitis: Secondary | ICD-10-CM | POA: Diagnosis not present

## 2024-03-08 DIAGNOSIS — J338 Other polyp of sinus: Secondary | ICD-10-CM

## 2024-03-08 DIAGNOSIS — J322 Chronic ethmoidal sinusitis: Secondary | ICD-10-CM

## 2024-03-09 NOTE — Progress Notes (Signed)
 Patient ID: Barbara Jefferson, female   DOB: 02-22-1981, 43 y.o.   MRN: [id number]  Follow-up: Chronic right maxillary, frontal, and ethmoid sinusitis and polyposis   Procedure: Right nasal/sinus debridement status post endoscopic sinus surgery.   Indication: The patient underwent right endoscopic sinus surgery to treat her chronic right-sided sinusitis and polyposis in April 2025.  The patient returns today reporting improvement in her nasal congestion and crusting.  She had no significant bleeding from the nasal cavities.  Currently the patient denies any facial pain, fever, or visual change.  Her surgical pathology is benign.   Anesthesia: Topical Xylocaine  and Neo-Synephrine.   Description: The patient is placed upright in the exam chair.  The right nasal cavity is sprayed with topical Xylocaine  and Neo-Synephrine.  A 0 rigid endoscope is used for the examination. The scope is first advanced past the right nostril into the right nasal cavity. A moderate amount of crusting is noted within the right nasal cavity and  the maxillary/ethmoid cavities.  The crusting is removed with suction catheters and alligator forceps, which are inserted in parallel with the rigid endoscope.  After the debridement procedure, the maxillary antrum and the ethmoid cavities are noted to be patent.  The frontal recess is also patent.   The patient tolerated the procedure well.   Follow up care: The patient is instructed to continue daily nasal saline irrigation.  The patient will return for re-evaluation in approximately 3 weeks.

## 2024-03-14 ENCOUNTER — Encounter: Payer: Self-pay | Admitting: Otolaryngology

## 2024-03-15 ENCOUNTER — Ambulatory Visit (INDEPENDENT_AMBULATORY_CARE_PROVIDER_SITE_OTHER): Payer: Managed Care, Other (non HMO)

## 2024-03-31 ENCOUNTER — Ambulatory Visit (INDEPENDENT_AMBULATORY_CARE_PROVIDER_SITE_OTHER): Admitting: Otolaryngology

## 2024-03-31 ENCOUNTER — Encounter (INDEPENDENT_AMBULATORY_CARE_PROVIDER_SITE_OTHER): Payer: Self-pay | Admitting: Otolaryngology

## 2024-03-31 DIAGNOSIS — J322 Chronic ethmoidal sinusitis: Secondary | ICD-10-CM

## 2024-03-31 DIAGNOSIS — Z9889 Other specified postprocedural states: Secondary | ICD-10-CM

## 2024-03-31 DIAGNOSIS — J338 Other polyp of sinus: Secondary | ICD-10-CM

## 2024-03-31 DIAGNOSIS — J321 Chronic frontal sinusitis: Secondary | ICD-10-CM | POA: Diagnosis not present

## 2024-03-31 DIAGNOSIS — J320 Chronic maxillary sinusitis: Secondary | ICD-10-CM

## 2024-03-31 NOTE — Progress Notes (Unsigned)
 Patient ID: Barbara Jefferson, female   DOB: 02-Mar-1981, 43 y.o.   MRN: [id number]  Follow-up: Chronic right maxillary, frontal, and ethmoid sinusitis and polyposis   Procedure: Right nasal/sinus debridement status post endoscopic sinus surgery.   Indication: The patient underwent right endoscopic sinus surgery to treat her chronic right-sided sinusitis and polyposis in April 2025.  The patient returns today reporting occasional nasal congestion and crusting.  She had no significant bleeding from the nasal cavities.  Currently the patient denies any facial pain, fever, or visual change.  Her surgical pathology is benign.   Anesthesia: Topical Xylocaine  and Neo-Synephrine.   Description: The patient is placed upright in the exam chair.  The right nasal cavity is sprayed with topical Xylocaine  and Neo-Synephrine.  A 0 rigid endoscope is used for the examination. The scope is first advanced past the right nostril into the right nasal cavity. A moderate amount of crusting is noted within the right nasal cavity and the maxillary/ethmoid cavities.  The crusting is removed with suction catheters and alligator forceps, which are inserted in parallel with the rigid endoscope.  After the debridement procedure, the maxillary antrum and the ethmoid cavities are noted to be patent.  The frontal recess is also patent.   The patient tolerated the procedure well.   Follow up care: The patient is instructed to continue as needed nasal saline irrigation.  The patient will return for re-evaluation in approximately 6 months, sooner if needed.

## 2024-06-21 ENCOUNTER — Telehealth (INDEPENDENT_AMBULATORY_CARE_PROVIDER_SITE_OTHER): Payer: Self-pay | Admitting: Otolaryngology

## 2024-06-21 NOTE — Telephone Encounter (Signed)
 Patient called today and stated that she is having nasal drainage and bad smell in her nose. Per Dr. Karis, I called in Levofloxacin  500 mg one a day for 7 days.

## 2024-09-26 ENCOUNTER — Encounter: Payer: Self-pay | Admitting: Podiatry

## 2024-09-27 ENCOUNTER — Encounter: Payer: Self-pay | Admitting: Podiatry

## 2024-09-27 ENCOUNTER — Ambulatory Visit (INDEPENDENT_AMBULATORY_CARE_PROVIDER_SITE_OTHER): Admitting: Podiatry

## 2024-09-27 DIAGNOSIS — L03031 Cellulitis of right toe: Secondary | ICD-10-CM

## 2024-09-27 NOTE — Patient Instructions (Signed)
 Soak Instructions    THE DAY AFTER THE PROCEDURE  Place 1/4 cup of epsom salts (or betadine , or white vinegar) in a quart of warm tap water.  Submerge your foot or feet with outer bandage intact for the initial soak; this will allow the bandage to become moist and wet for easy lift off.  Once you remove your bandage, continue to soak in the solution for 20 minutes.  This soak should be done twice a day.  Next, remove your foot or feet from solution, blot dry the affected area and apply Neosporin or antibiotic ointment.  Then cover with a regular Band-Aid.  Do this for at least 2 weeks.  Longer if you are still having drainage redness or irritation  IF YOUR SKIN BECOMES IRRITATED WHILE USING THESE INSTRUCTIONS, IT IS OKAY TO SWITCH TO  WHITE VINEGAR AND WATER. Or you may use antibacterial soap and water to keep the toe clean  Monitor for any signs/symptoms of infection. Call the office immediately if any occur or go directly to the emergency room. Call with any questions/concerns.

## 2024-09-28 ENCOUNTER — Ambulatory Visit: Admitting: Podiatry

## 2024-09-28 NOTE — Progress Notes (Signed)
  Subjective:  Patient ID: Barbara Jefferson, female    DOB: 07-12-1981,  MRN: [id number]  Chief Complaint  Patient presents with   Diabetes    I had toe fungus to begin with.  Now it has this problem. Saw Comer Aldrich, NP - 09/26/2024; A1c - 9.0  (Hallux medial border)     43 y.o. female presents with the above complaint. History confirmed with patient.  She is currently taking a prescription for Bactrim she started this yesterday  Objective:  Physical Exam: warm, good capillary refill, no trophic changes or ulcerative lesions, normal DP and PT pulses, normal sensory exam, and right hallux with loosening of nail and severe paronychia.  Assessment:   1. Paronychia of toe, right      Plan:  Patient was evaluated and treated and all questions answered.    Ingrown Nail, right -Patient elects to proceed with minor surgery to remove ingrown toenail today. Consent reviewed and signed by patient. -Ingrown nail excised. See procedure note. -Educated on post-procedure care including soaking. Written instructions provided and reviewed. - Culture taken of drainage  Procedure: Excision of Ingrown Toenail Location: Right 1st toe  nail  Anesthesia: Lidocaine  1% plain; 1.5 mL and Marcaine  0.5% plain; 1.5 mL, digital block. Skin Prep: Betadine. Dressing: Silvadene; telfa; dry, sterile, compression dressing. Technique: Following skin prep, the toe was exsanguinated and a tourniquet was secured at the base of the toe. The affected nail border was freed and excised.  There was purulent drainage.  Culture of this was taken.  It was irrigated thoroughly with saline.  The tourniquet was then removed and sterile dressing applied. Disposition: Patient tolerated procedure well.    Return in about 3 weeks (around 10/18/2024) for nail re-check.

## 2024-10-04 ENCOUNTER — Ambulatory Visit (INDEPENDENT_AMBULATORY_CARE_PROVIDER_SITE_OTHER): Admitting: Otolaryngology

## 2024-10-24 ENCOUNTER — Ambulatory Visit

## 2024-10-24 DIAGNOSIS — L03031 Cellulitis of right toe: Secondary | ICD-10-CM | POA: Diagnosis not present

## 2024-10-24 NOTE — Progress Notes (Signed)
"  °  Subjective:  Patient ID: Barbara Jefferson, female    DOB: [birth year],  MRN: [id number]  Chief Complaint  Patient presents with   Ingrown Toenail    Rm10 Nail check right great toe/ patient says that she is doing well .    43 y.o. female presents with the above complaint. She is 4 weeks status post right hallux nail avulsioon with Dr. Silva. She completed a course of Bactrim. Culture taken at last visit demonstrates organisms sensitive to Bactrim.   Objective:  Physical Exam: warm, good capillary refill, no trophic changes or ulcerative lesions, normal DP and PT pulses, normal sensory exam. Right hallux nail avulsed with mixed stable fibrotic ingrowth. Small, stable eschar over central portion of nail bed. No signs of infection.   Assessment:   1. Paronychia of toe, right       Plan:  Patient was evaluated and treated and all questions answered.  Ingrown Nail, right -Patient doing well s/p right ingrown hallux nail avulsion. No infection. Healing in nicely.  - Patient continuing to take terbinafine from other provider. Continue for a total of 90 days  RTC PRN   Prentice Ovens, DPM "

## 2024-10-25 ENCOUNTER — Ambulatory Visit: Admitting: Podiatry

## 2024-10-31 ENCOUNTER — Ambulatory Visit: Payer: Self-pay | Admitting: Podiatry

## 2024-11-08 ENCOUNTER — Ambulatory Visit (INDEPENDENT_AMBULATORY_CARE_PROVIDER_SITE_OTHER): Admitting: Otolaryngology

## 2024-11-08 VITALS — BP 120/77 | HR 90 | Ht 64.0 in | Wt 205.0 lb

## 2024-11-08 DIAGNOSIS — J338 Other polyp of sinus: Secondary | ICD-10-CM

## 2024-11-08 DIAGNOSIS — H6121 Impacted cerumen, right ear: Secondary | ICD-10-CM | POA: Diagnosis not present

## 2024-11-08 DIAGNOSIS — J320 Chronic maxillary sinusitis: Secondary | ICD-10-CM

## 2024-11-08 DIAGNOSIS — J322 Chronic ethmoidal sinusitis: Secondary | ICD-10-CM

## 2024-11-08 DIAGNOSIS — J321 Chronic frontal sinusitis: Secondary | ICD-10-CM

## 2024-11-08 DIAGNOSIS — J329 Chronic sinusitis, unspecified: Secondary | ICD-10-CM | POA: Diagnosis not present

## 2024-11-08 MED ORDER — LEVOFLOXACIN 500 MG PO TABS: 500.0000 mg | ORAL_TABLET | Freq: Every day | ORAL | 0 refills | Status: AC

## 2024-11-08 MED ORDER — PREDNISONE 10 MG (21) PO TBPK: ORAL_TABLET | ORAL | 0 refills | Status: AC

## 2024-11-09 DIAGNOSIS — H6121 Impacted cerumen, right ear: Secondary | ICD-10-CM | POA: Insufficient documentation

## 2024-11-09 NOTE — Progress Notes (Signed)
 Patient ID: Barbara Jefferson, female   DOB: 27-Mar-1981, 44 y.o.   MRN: [id number]  Follow-up: Chronic maxillary, frontal, and ethmoid sinusitis and polyposis  HPI: The patient is a 44 year old female who returns today for follow-up evaluation.  She has a history of chronic rhinosinusitis and polyposis. She underwent right sided endoscopic sinus surgery in April 2025.  She also has a history of eustachian tube dysfunction.  She underwent right myringotomy and tube placement in January 2025.  The patient returns today complaining of persistent sinus symptoms.  She has frequent facial pain and pressure.  She has also noted increasing clogging sensation in her right ear.  Over the past week, she has also noted intermittent left periorbital pain.  Currently she denies any fever or visual change.  Exam: General: Communicates without difficulty, well nourished, no acute distress. Head: Normocephalic, no evidence injury, no tenderness, facial buttresses intact without stepoff. Face/sinus: No tenderness to palpation and percussion. Facial movement is normal and symmetric. Eyes: PERRL, EOMI. No scleral icterus, conjunctivae clear. Neuro: CN II exam reveals vision grossly intact.  No nystagmus at any point of gaze. Ears: Auricles well formed without lesions.  Right ear cerumen impaction.  The right tube has extruded into the ear canal, and is encased within the cerumen.  The left ear canal and tympanic membrane are normal.  Nose: External evaluation reveals normal support and skin without lesions.  Dorsum is intact.  Anterior rhinoscopy reveals erythematous and edematous nasal mucosa mucosa over anterior aspect of inferior turbinates and intact septum. Oral:  Oral cavity and oropharynx are intact, symmetric, without erythema or edema.  Mucosa is moist without lesions. Neck: Full range of motion without pain.  There is no significant lymphadenopathy.  No masses palpable.  Thyroid bed within normal limits to palpation.   Parotid glands and submandibular glands equal bilaterally without mass.  Trachea is midline. Neuro:  CN 2-12 grossly intact.   Procedure: Right ear cerumen disimpaction Anesthesia: None Description: Under the operating microscope, the cerumen is carefully removed with a combination of cerumen currette, alligator forceps, and suction catheters.  The extruded tube is also removed.  After the cerumen is removed, the TMs are noted to be normal.  No mass, erythema, or lesions. The patient tolerated the procedure well.    Assessment: 1.  Recurrent rhinosinusitis, with erythematous and edematous nasal mucosa. 2.  The right ventilating tube has extruded into the ear canals, and is encased within cerumen.  After the cerumen and tube removal procedure, both tympanic membranes are noted to be intact.  Plan: 1.  The physical exam findings are reviewed with the patient. 2.  Otomicroscopy with right ear cerumen disimpaction and tube removal. 3.  Levofloxacin  500 mg p.o. daily for 7 days. 4.  Prednisone  Dosepak for 6 days. 5.  Resume medicated nasal rinse daily.  The patient will obtain the medicated nasal rinse from [place]. 6.  The patient will return for reevaluation in 6 weeks.

## 2024-11-15 ENCOUNTER — Ambulatory Visit (INDEPENDENT_AMBULATORY_CARE_PROVIDER_SITE_OTHER): Admitting: Otolaryngology

## 2024-11-16 ENCOUNTER — Other Ambulatory Visit (INDEPENDENT_AMBULATORY_CARE_PROVIDER_SITE_OTHER): Payer: Self-pay | Admitting: Otolaryngology

## 2024-11-16 DIAGNOSIS — J324 Chronic pansinusitis: Secondary | ICD-10-CM

## 2024-11-17 ENCOUNTER — Ambulatory Visit (HOSPITAL_COMMUNITY)
Admission: RE | Admit: 2024-11-17 | Discharge: 2024-11-17 | Disposition: A | Discharge status: Home / Self Care | Source: Ambulatory Visit | Attending: Otolaryngology | Admitting: Otolaryngology

## 2024-11-17 DIAGNOSIS — J324 Chronic pansinusitis: Secondary | ICD-10-CM | POA: Diagnosis present

## 2024-11-21 ENCOUNTER — Telehealth (INDEPENDENT_AMBULATORY_CARE_PROVIDER_SITE_OTHER): Payer: Self-pay | Admitting: Otolaryngology

## 2024-11-21 NOTE — Telephone Encounter (Signed)
 Mrs. Barbara Jefferson called today 11/21/2024 at 3:07 pm and wanted Dr. Karis to take a look at her CT scan that she had done last week and let her know what he think. She is also having pain in her left eye. She would like a call back once CT scan is reviewed please

## 2024-11-23 NOTE — Telephone Encounter (Signed)
 Per dr. Karis, I called patient and told her that her sinus CT did not show any infection. Her scan was better then last year. Scan showed right middle ear effusion. She will follow up on 12/13/24 to review her CT.

## 2024-12-13 ENCOUNTER — Ambulatory Visit (INDEPENDENT_AMBULATORY_CARE_PROVIDER_SITE_OTHER): Admitting: Otolaryngology

## 2024-12-22 ENCOUNTER — Ambulatory Visit (INDEPENDENT_AMBULATORY_CARE_PROVIDER_SITE_OTHER): Admitting: Otolaryngology
